# Patient Record
Sex: Male | Born: 1960 | Hispanic: No | Marital: Single | Smoking: Never smoker
Health system: Southern US, Community
[De-identification: ages and names within clinical notes are randomized; demographics above are authoritative.]

## PROBLEM LIST (undated history)

## (undated) DIAGNOSIS — R002 Palpitations: Secondary | ICD-10-CM

## (undated) DIAGNOSIS — F419 Anxiety disorder, unspecified: Secondary | ICD-10-CM

## (undated) DIAGNOSIS — E785 Hyperlipidemia, unspecified: Secondary | ICD-10-CM

## (undated) HISTORY — DX: Hyperlipidemia, unspecified: E78.5

## (undated) HISTORY — DX: Palpitations: R00.2

## (undated) HISTORY — DX: Anxiety disorder, unspecified: F41.9

---
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
## 2006-05-21 HISTORY — PX: US ECHOCARDIOGRAPHY: HXRAD669

## 2010-01-27 ENCOUNTER — Ambulatory Visit: Payer: Self-pay | Admitting: Cardiovascular Disease

## 2010-02-04 ENCOUNTER — Ambulatory Visit: Payer: Self-pay | Admitting: Cardiovascular Disease

## 2010-12-01 ENCOUNTER — Other Ambulatory Visit: Payer: Self-pay | Admitting: *Deleted

## 2010-12-01 MED ORDER — METOPROLOL SUCCINATE ER 50 MG PO TB24: 50.0000 mg | ORAL_TABLET | Freq: Every day | ORAL | Status: DC

## 2010-12-01 NOTE — Telephone Encounter (Signed)
PT CALLED WITH MESSAGE TO MAKE YEARLY OV, SCRIPT REFILLED

## 2010-12-27 ENCOUNTER — Encounter: Payer: Self-pay | Admitting: Cardiovascular Disease

## 2010-12-28 ENCOUNTER — Ambulatory Visit (INDEPENDENT_AMBULATORY_CARE_PROVIDER_SITE_OTHER): Payer: 59 | Admitting: Cardiovascular Disease

## 2010-12-28 ENCOUNTER — Encounter: Payer: Self-pay | Admitting: Cardiovascular Disease

## 2010-12-28 VITALS — BP 112/80 | HR 60 | Ht 70.0 in | Wt 212.0 lb

## 2010-12-28 DIAGNOSIS — E785 Hyperlipidemia, unspecified: Secondary | ICD-10-CM

## 2010-12-28 NOTE — Progress Notes (Signed)
Andrew Harrell Date of Birth  [birth year] [facility] [address]     [address] [place], [place]  [address] [phone]  Fax  [phone]  History of Present Illness:  Andrew Harrell is a relatively healthy 50 year old gentleman with a history of dyslipidemia. He has a strong family history of coronary disease. He's felt very well. He's not had any episodes of chest pain or shortness of breath.  He's exercising on a regular basis and is able to do all of his normal activities without any limitations.   Current Outpatient Prescriptions on File Prior to Visit  Medication Sig Dispense Refill  . aspirin 81 MG tablet Take 81 mg by mouth daily.        Marland Kitchen atorvastatin (LIPITOR) 40 MG tablet Take 40 mg by mouth daily.        Marland Kitchen FLUOXETINE HCL PO Take by mouth daily.        . metoprolol tartrate (LOPRESSOR) 25 MG tablet Take 25 mg by mouth daily.        Marland Kitchen NIACIN PO Take 1,500 mg by mouth.        . omega-3 acid ethyl esters (LOVAZA) 1 G capsule Take 2 g by mouth daily.        . Saw Palmetto, Serenoa repens, (SAW PALMETTO PO) Take by mouth.          Allergies  Allergen Reactions  . Prednisone     Causes a Rapid Heart Rate    Past Medical History  Diagnosis Date  . Dyslipidemia     with a low HDL  . Palpitations     Past Surgical History  Procedure Date  . US echocardiography 05-21-2006    Est EF 55-60%  . Hernia repair 1966    History  Smoking status  . Never Smoker   Smokeless tobacco  . Not on file    History  Alcohol Use No    Family History  Problem Relation Age of Onset  . Coronary artery disease Father   . Heart disease Father   . Heart failure Father   . Hyperlipidemia Father     Reviw of Systems:  Reviewed in the HPI.  All other systems are negative.  Physical Exam: BP 112/80  Pulse 60  Ht 5\' 10"  (1.778 m)  Wt 212 lb (96.163 kg)  BMI 30.42 kg/m2 The patient is alert and oriented x 3.  The mood and affect are normal.     Skin: warm and dry.  Color is normal.    HEENT:   the sclera are nonicteric.  The mucous membranes are moist.  The carotids are 2+ without bruits.  There is no thyromegaly.  There is no JVD.    Lungs: clear.  The chest wall is non tender.    Heart: regular rate with a normal S1 and S2.  There are no murmurs, gallops, or rubs. The PMI is not displaced.     Abdomen: good bowel sounds.  There is no guarding or rebound.  There is no hepatosplenomegaly or tenderness.  There are no masses.   Extremities:  no clubbing, cyanosis, or edema.  The legs are without rashes.  The distal pulses are intact.   Neuro:  Cranial nerves II - XII are intact.  Motor and sensory functions are intact.    The gait is normal.  ECG:  Assessment / Plan:

## 2010-12-28 NOTE — Assessment & Plan Note (Signed)
Plan is doing fairly well. He's on fairly high-dose therapy for his dyslipidemia. We are treating him aggressively because he has a very strong family history of coronary artery disease.  We'll continue with the same medications. I've encouraged him to exercise on a regular basis.

## 2010-12-29 ENCOUNTER — Encounter: Payer: Self-pay | Admitting: Cardiovascular Disease

## 2011-03-24 ENCOUNTER — Telehealth: Payer: Self-pay | Admitting: Cardiovascular Disease

## 2011-03-24 NOTE — Telephone Encounter (Signed)
Pt calling re getting his medical records straightened out, seems incorrect info in his chart

## 2011-03-27 ENCOUNTER — Telehealth: Payer: Self-pay | Admitting: Cardiovascular Disease

## 2011-03-27 NOTE — Telephone Encounter (Signed)
Pt called, msg left at home number, cell number out of service, asked him to update cell number and to call back to discuss MR.

## 2011-03-27 NOTE — Telephone Encounter (Signed)
Pt wanted to know if he had on his last ov a dx of CAD, answered no, only has hyperlipidemia.

## 2011-03-27 NOTE — Telephone Encounter (Addendum)
Pt CAlled asking for Copies Of Medical Records, They are copied he will pick up Wednesday 03/29/11/km    03/27/11/km  Pt Picked up Records 03/28/11/km

## 2011-05-09 HISTORY — PX: COLONOSCOPY: SHX174

## 2011-06-05 ENCOUNTER — Other Ambulatory Visit: Payer: Self-pay | Admitting: *Deleted

## 2011-06-06 ENCOUNTER — Other Ambulatory Visit: Payer: Self-pay | Admitting: *Deleted

## 2011-06-06 MED ORDER — METOPROLOL SUCCINATE ER 50 MG PO TB24: 50.0000 mg | ORAL_TABLET | Freq: Every day | ORAL | Status: DC

## 2011-06-08 ENCOUNTER — Other Ambulatory Visit: Payer: Self-pay | Admitting: *Deleted

## 2011-07-28 ENCOUNTER — Encounter: Payer: Self-pay | Admitting: Gastroenterology

## 2011-09-04 ENCOUNTER — Ambulatory Visit (AMBULATORY_SURGERY_CENTER): Payer: 59 | Admitting: *Deleted

## 2011-09-04 ENCOUNTER — Encounter: Payer: Self-pay | Admitting: Gastroenterology

## 2011-09-04 VITALS — Ht 70.0 in | Wt 215.9 lb

## 2011-09-04 DIAGNOSIS — Z1211 Encounter for screening for malignant neoplasm of colon: Secondary | ICD-10-CM

## 2011-09-04 MED ORDER — PEG-KCL-NACL-NASULF-NA ASC-C 100 G PO SOLR: ORAL | Status: DC

## 2011-09-18 ENCOUNTER — Ambulatory Visit (AMBULATORY_SURGERY_CENTER): Payer: 59 | Admitting: Gastroenterology

## 2011-09-18 ENCOUNTER — Encounter: Payer: Self-pay | Admitting: Gastroenterology

## 2011-09-18 VITALS — BP 114/64 | HR 66 | Temp 97.6°F | Resp 15 | Ht 70.0 in | Wt 215.0 lb

## 2011-09-18 DIAGNOSIS — K5730 Diverticulosis of large intestine without perforation or abscess without bleeding: Secondary | ICD-10-CM

## 2011-09-18 DIAGNOSIS — Z1211 Encounter for screening for malignant neoplasm of colon: Secondary | ICD-10-CM

## 2011-09-18 MED ORDER — SODIUM CHLORIDE 0.9 % IV SOLN: 500.0000 mL | INTRAVENOUS | Status: DC

## 2011-09-18 NOTE — Op Note (Signed)
[facility] [address] [place], [place]  [address]  COLONOSCOPY PROCEDURE REPORT  PATIENT:  Andrew Harrell, Andrew Harrell  MR#:  [id number] BIRTHDATE:  08/08/1960, 50 yrs. old  GENDER:  male ENDOSCOPIST:  Vania Rea. Jarold Motto, MD, [facility] REF. BY:  Jarome Matin, M.D. PROCEDURE DATE:  09/18/2011 PROCEDURE:  Average-risk screening colonoscopy G0121 ASA CLASS:  Class II INDICATIONS:  Routine Risk Screening MEDICATIONS:   propofol (Diprivan) 150 mg IV  DESCRIPTION OF PROCEDURE:   After the risks and benefits and of the procedure were explained, informed consent was obtained. Digital rectal exam was performed and revealed no abnormalities. The LB [id number] endoscope was introduced through the anus and advanced to the cecum, which was identified by both the appendix and ileocecal valve.  The quality of the prep was excellent, using MoviPrep.  The instrument was then slowly withdrawn as the colon was fully examined. <<PROCEDUREIMAGES>>  FINDINGS:  There were mild diverticular changes in left colon. diverticulosis was found.  No polyps or cancers were seen.  This was otherwise a normal examination of the colon.   Retroflexed views in the rectum revealed no abnormalities.    The scope was then withdrawn from the patient and the procedure completed.  COMPLICATIONS:  None ENDOSCOPIC IMPRESSION: 1) Diverticulosis,mild,left sided diverticulosis 2) No polyps or cancers 3) Otherwise normal examination RECOMMENDATIONS: 1) High fiber diet. 2) Continue current colorectal screening recommendations for "routine risk" patients with a repeat colonoscopy in 10 years.  REPEAT EXAM:  No  ______________________________ Vania Rea. Jarold Motto, MD, [facility]  CC:  n. eSIGNED:   Vania Rea. Elka Satterfield at 09/18/2011 08:50 AM  [name], [phone]

## 2011-09-18 NOTE — Progress Notes (Signed)
Patient did not experience any of the following events: a burn prior to discharge; a fall within the facility; wrong site/side/patient/procedure/implant event; or a hospital transfer or hospital admission upon discharge from the facility. (G8907) Patient did not have preoperative order for IV antibiotic SSI prophylaxis. (G8918)  

## 2011-09-18 NOTE — Patient Instructions (Signed)

## 2011-09-19 ENCOUNTER — Telehealth: Payer: Self-pay | Admitting: *Deleted

## 2011-09-19 NOTE — Telephone Encounter (Signed)
  Follow up Call-  Call back number 09/18/2011  Post procedure Call Back phone  # [phone]  Permission to leave phone message Yes     Patient questions:  Do you have a fever, pain , or abdominal swelling? no Pain Score  0 *  Have you tolerated food without any problems? yes  Have you been able to return to your normal activities? yes  Do you have any questions about your discharge instructions: Diet   no Medications  no Follow up visit  no  Do you have questions or concerns about your Care? no  Actions: * If pain score is 4 or above: No action needed, pain <4.

## 2012-08-02 ENCOUNTER — Other Ambulatory Visit: Payer: Self-pay | Admitting: *Deleted

## 2012-08-02 MED ORDER — METOPROLOL SUCCINATE ER 50 MG PO TB24: 50.0000 mg | ORAL_TABLET | Freq: Every day | ORAL | Status: DC

## 2012-08-02 NOTE — Telephone Encounter (Signed)
Fax Received. Refill Completed. Andrew Harrell (R.M.A)  NO REFILL UNTIL APPOINTMENT

## 2012-10-28 ENCOUNTER — Ambulatory Visit (INDEPENDENT_AMBULATORY_CARE_PROVIDER_SITE_OTHER): Payer: Self-pay | Admitting: Cardiovascular Disease

## 2012-10-28 ENCOUNTER — Encounter: Payer: Self-pay | Admitting: Cardiovascular Disease

## 2012-10-28 VITALS — BP 110/80 | HR 62 | Ht 70.0 in | Wt 214.0 lb

## 2012-10-28 DIAGNOSIS — E785 Hyperlipidemia, unspecified: Secondary | ICD-10-CM | POA: Insufficient documentation

## 2012-10-28 LAB — LIPID PANEL
Cholesterol: 124 mg/dL (ref 0–200)
LDL Cholesterol: 70 mg/dL (ref 0–99)
Total CHOL/HDL Ratio: 3

## 2012-10-28 LAB — BASIC METABOLIC PANEL
BUN: 11 mg/dL (ref 6–23)
CO2: 30 mEq/L (ref 19–32)
Chloride: 104 mEq/L (ref 96–112)
Creatinine, Ser: 1 mg/dL (ref 0.4–1.5)

## 2012-10-28 LAB — HEPATIC FUNCTION PANEL
Alkaline Phosphatase: 36 U/L — ABNORMAL LOW (ref 39–117)
Bilirubin, Direct: 0.1 mg/dL (ref 0.0–0.3)

## 2012-10-28 MED ORDER — METOPROLOL SUCCINATE ER 50 MG PO TB24: 50.0000 mg | ORAL_TABLET | Freq: Every day | ORAL | Status: DC

## 2012-10-28 NOTE — Assessment & Plan Note (Signed)
Andrew Harrell is doing well.  No complaints.  Will check labs today.  Continue current meds.  I will see him in 1 year for OV, labs, and ECG.

## 2012-10-28 NOTE — Progress Notes (Signed)
Andrew Harrell Date of Birth  [birth year] Andrew Harrell [facility] [address]     [address] [place], [place]  [address] [phone]  Fax  [phone]  History of Present Illness:  Andrew Harrell is a relatively healthy 52 year old gentleman with a history of dyslipidemia. He has a strong family history of coronary disease. He's felt very well. He's not had any episodes of chest pain or shortness of breath.  He's exercising on a regular basis and is able to do all of his normal activities without any limitations.  October 28, 2012:  Andrew Harrell has done well.  No CP or dyspnea.  He   Current Outpatient Prescriptions on File Prior to Visit  Medication Sig Dispense Refill  . aspirin 81 MG tablet Take 81 mg by mouth daily.        Marland Kitchen atorvastatin (LIPITOR) 40 MG tablet Take 40 mg by mouth daily.        Marland Kitchen FLUOXETINE HCL PO Take by mouth daily.        . metoprolol succinate (TOPROL-XL) 50 MG 24 hr tablet Take 1 tablet (50 mg total) by mouth daily. Take with or immediately following a meal.  30 tablet  3  . NIACIN PO Take 1,500 mg by mouth.        . omega-3 acid ethyl esters (LOVAZA) 1 G capsule Take 2 g by mouth daily.        . Saw Palmetto, Serenoa repens, (SAW PALMETTO PO) Take by mouth.         No current facility-administered medications on file prior to visit.    Allergies  Allergen Reactions  . Prednisone     Causes a Rapid Heart Rate    Past Medical History  Diagnosis Date  . Dyslipidemia     with a low HDL  . Palpitations   . Anxiety     Past Surgical History  Procedure Laterality Date  . US echocardiography  05-21-2006    Est EF 55-60%  . Hernia repair  1966    History  Smoking status  . Never Smoker   Smokeless tobacco  . Never Used    History  Alcohol Use No    Family History  Problem Relation Age of Onset  . Coronary artery disease Father   . Heart disease Father   . Heart failure Father   . Hyperlipidemia Father   . Colon cancer Neg  Hx   . Stomach cancer Neg Hx     Reviw of Systems:  Reviewed in the HPI.  All other systems are negative.  Physical Exam: BP 110/80  Pulse 62  Ht 5\' 10"  (1.778 m)  Wt 214 lb (97.07 kg)  BMI 30.71 kg/m2 The patient is alert and oriented x 3.  The mood and affect are normal.   Skin: warm and dry.  Color is normal.    HEENT:   the sclera are nonicteric.  The mucous membranes are moist.  The carotids are 2+ without bruits.  There is no thyromegaly.  There is no JVD.    Lungs: clear.  The chest wall is non tender.    Heart: regular rate with a normal S1 and S2.  There are no murmurs, gallops, or rubs. The PMI is not displaced.     Abdomen: good bowel sounds.  There is no guarding or rebound.  There is no hepatosplenomegaly or tenderness.  There are no masses.   Extremities:  no clubbing, cyanosis, or edema.  The legs are without rashes.  The distal pulses are intact.   Neuro:  Cranial nerves II - XII are intact.  Motor and sensory functions are intact.    The gait is normal.  ECG: October 28, 2012:  NSR at 62, no ST or T wave changes   Assessment / Plan:

## 2012-10-28 NOTE — Patient Instructions (Signed)
Your physician recommends that you return for lab work in: today lipid hepatic bmet  Your physician wants you to follow-up in: 1 year  You will receive a reminder letter in the mail two months in advance. If you don't receive a letter, please call our office to schedule the follow-up appointment.  Your physician recommends that you return for a FASTING lipid profile: 1 year

## 2013-03-13 ENCOUNTER — Other Ambulatory Visit: Payer: Self-pay

## 2013-11-14 ENCOUNTER — Ambulatory Visit (INDEPENDENT_AMBULATORY_CARE_PROVIDER_SITE_OTHER): Payer: BC Managed Care – PPO | Admitting: *Deleted

## 2013-11-14 DIAGNOSIS — E785 Hyperlipidemia, unspecified: Secondary | ICD-10-CM

## 2013-11-14 LAB — CBC WITH DIFFERENTIAL/PLATELET
BASOS PCT: 0.3 % (ref 0.0–3.0)
Basophils Absolute: 0 10*3/uL (ref 0.0–0.1)
Eosinophils Absolute: 0.1 10*3/uL (ref 0.0–0.7)
Eosinophils Relative: 1.7 % (ref 0.0–5.0)
HEMATOCRIT: 44.4 % (ref 39.0–52.0)
HEMOGLOBIN: 15 g/dL (ref 13.0–17.0)
LYMPHS ABS: 2.1 10*3/uL (ref 0.7–4.0)
Lymphocytes Relative: 38 % (ref 12.0–46.0)
MCHC: 33.8 g/dL (ref 30.0–36.0)
MCV: 99.6 fl (ref 78.0–100.0)
MONOS PCT: 9.9 % (ref 3.0–12.0)
Monocytes Absolute: 0.6 10*3/uL (ref 0.1–1.0)
NEUTROS ABS: 2.8 10*3/uL (ref 1.4–7.7)
Neutrophils Relative %: 50.1 % (ref 43.0–77.0)
PLATELETS: 194 10*3/uL (ref 150.0–400.0)
RBC: 4.45 Mil/uL (ref 4.22–5.81)
RDW: 13.2 % (ref 11.5–15.5)
WBC: 5.6 10*3/uL (ref 4.0–10.5)

## 2013-11-14 LAB — BASIC METABOLIC PANEL
BUN: 16 mg/dL (ref 6–23)
CALCIUM: 8.8 mg/dL (ref 8.4–10.5)
CO2: 31 mEq/L (ref 19–32)
Chloride: 103 mEq/L (ref 96–112)
Creatinine, Ser: 1 mg/dL (ref 0.4–1.5)
GFR: 81.23 mL/min (ref >60.00)
GLUCOSE: 97 mg/dL (ref 70–99)
POTASSIUM: 3.7 meq/L (ref 3.5–5.1)
SODIUM: 138 meq/L (ref 135–145)

## 2013-11-14 LAB — LIPID PANEL
Cholesterol: 140 mg/dL (ref 0–200)
HDL: 39.6 mg/dL (ref >39.00)
LDL CALC: 86 mg/dL (ref 0–99)
NonHDL: 100.4
Total CHOL/HDL Ratio: 4
Triglycerides: 72 mg/dL (ref 0.0–149.0)
VLDL: 14.4 mg/dL (ref 0.0–40.0)

## 2013-11-14 LAB — HEPATIC FUNCTION PANEL
ALT: 35 U/L (ref 0–53)
AST: 30 U/L (ref 0–37)
Albumin: 3.7 g/dL (ref 3.5–5.2)
Alkaline Phosphatase: 30 U/L — ABNORMAL LOW (ref 39–117)
Bilirubin, Direct: 0.2 mg/dL (ref 0.0–0.3)
Total Bilirubin: 1.2 mg/dL (ref 0.2–1.2)
Total Protein: 6.5 g/dL (ref 6.0–8.3)

## 2013-11-17 ENCOUNTER — Ambulatory Visit (INDEPENDENT_AMBULATORY_CARE_PROVIDER_SITE_OTHER): Payer: BC Managed Care – PPO | Admitting: Cardiovascular Disease

## 2013-11-17 ENCOUNTER — Encounter: Payer: Self-pay | Admitting: Cardiovascular Disease

## 2013-11-17 VITALS — BP 108/64 | HR 68 | Ht 70.0 in | Wt 211.8 lb

## 2013-11-17 DIAGNOSIS — E785 Hyperlipidemia, unspecified: Secondary | ICD-10-CM

## 2013-11-17 NOTE — Assessment & Plan Note (Signed)
[name] seems to be doing well. His lipids are well controlled. He needs to get out and walk a little bit more than he has been. He's not having any episodes of chest pain or shortness breath. We'll continue with his same medications. [place] again in one year.

## 2013-11-17 NOTE — Patient Instructions (Signed)
Your physician recommends that you continue on your current medications as directed. Please refer to the Current Medication list given to you today.  Your physician wants you to follow-up in: 1 year with Dr. Nahser.  You will receive a reminder letter in the mail two months in advance. If you don't receive a letter, please call our office to schedule the follow-up appointment.  

## 2013-11-17 NOTE — Progress Notes (Signed)
Andrew CoganBenjamin Harrell Date of Birth  12-31-1960 [facility] [address]     [address] [place], [place]  [address] [phone]  Fax  [phone]  History of Present Illness:  Andrew Harrell is a relatively healthy 53 year old gentleman with a history of dyslipidemia. He has a strong family history of coronary disease. He's felt very well. He's not had any episodes of chest pain or shortness of breath.  He's exercising on a regular basis and is able to do all of his normal activities without any limitations.  October 28, 2012:  Andrew Harrell has done well.  No CP or dyspnea.     November 17, 2013: Andrew Harrell is doing well.   Not exercising as much - still walks twice a week.      Current Outpatient Prescriptions on File Prior to Visit  Medication Sig Dispense Refill  . aspirin 81 MG tablet Take 81 mg by mouth daily.        Marland Kitchen. atorvastatin (LIPITOR) 40 MG tablet Take 40 mg by mouth daily.        Marland Kitchen. FLUOXETINE HCL PO Take by mouth daily.        . metoprolol succinate (TOPROL-XL) 50 MG 24 hr tablet Take 1 tablet (50 mg total) by mouth daily. Take with or immediately following a meal.  30 tablet  3  . NIACIN PO Take 1,500 mg by mouth.        . omega-3 acid ethyl esters (LOVAZA) 1 G capsule Take 2 g by mouth daily.        . Saw Palmetto, Serenoa repens, (SAW PALMETTO PO) Take by mouth.         No current facility-administered medications on file prior to visit.    Allergies  Allergen Reactions  . Prednisone     Causes a Rapid Heart Rate    Past Medical History  Diagnosis Date  . Dyslipidemia     with a low HDL  . Palpitations   . Anxiety     Past Surgical History  Procedure Laterality Date  . [place] echocardiography  05-21-2006    Est EF 55-60%  . Hernia repair  1966    History  Smoking status  . Never Smoker   Smokeless tobacco  . Never Used    History  Alcohol Use No    Family History  Problem Relation Age of Onset  . Coronary artery disease Father    . Heart disease Father   . Heart failure Father   . Hyperlipidemia Father   . Colon cancer Neg Hx   . Stomach cancer Neg Hx     Reviw of Systems:  Reviewed in the HPI.  All other systems are negative.  Physical Exam: BP 108/64  Pulse 68  Ht 5\' 10"  (1.778 m)  Wt 211 lb 12.8 oz (96.072 kg)  BMI 30.39 kg/m2 The patient is alert and oriented x 3.  The mood and affect are normal.   Skin: warm and dry.  Color is normal.    HEENT:   the sclera are nonicteric.  The mucous membranes are moist.  The carotids are 2+ without bruits.  There is no thyromegaly.  There is no JVD.    Lungs: clear.  The chest wall is non tender.    Heart: regular rate with a normal S1 and S2.  There are no murmurs, gallops, or rubs. The PMI is not displaced.     Abdomen: good bowel sounds.  There is no guarding or rebound.  There is no hepatosplenomegaly or tenderness.  There are no masses.   Extremities:  no clubbing, cyanosis, or edema.  The legs are without rashes.  The distal pulses are intact.   Neuro:  Cranial nerves II - XII are intact.  Motor and sensory functions are intact.    The gait is normal.  ECG: November 17, 2013:  NSR  At 71.    Assessment / Plan:

## 2013-11-27 ENCOUNTER — Other Ambulatory Visit: Payer: Self-pay | Admitting: Cardiovascular Disease

## 2014-11-19 ENCOUNTER — Other Ambulatory Visit: Payer: Self-pay

## 2014-11-19 MED ORDER — METOPROLOL SUCCINATE ER 50 MG PO TB24: ORAL_TABLET | ORAL | Status: DC

## 2015-01-12 ENCOUNTER — Encounter: Payer: Self-pay | Admitting: Cardiovascular Disease

## 2015-01-12 ENCOUNTER — Ambulatory Visit (INDEPENDENT_AMBULATORY_CARE_PROVIDER_SITE_OTHER): Payer: No Typology Code available for payment source | Admitting: Cardiovascular Disease

## 2015-01-12 VITALS — BP 98/70 | HR 60 | Ht 70.0 in | Wt 215.2 lb

## 2015-01-12 DIAGNOSIS — E785 Hyperlipidemia, unspecified: Secondary | ICD-10-CM

## 2015-01-12 LAB — LIPID PANEL
CHOL/HDL RATIO: 3
Cholesterol: 118 mg/dL (ref 0–200)
HDL: 35.9 mg/dL — ABNORMAL LOW (ref >39.00)
LDL Cholesterol: 66 mg/dL (ref 0–99)
NONHDL: 82.32
Triglycerides: 84 mg/dL (ref 0.0–149.0)
VLDL: 16.8 mg/dL (ref 0.0–40.0)

## 2015-01-12 LAB — HEPATIC FUNCTION PANEL
ALK PHOS: 27 U/L — AB (ref 39–117)
ALT: 37 U/L (ref 0–53)
AST: 28 U/L (ref 0–37)
Albumin: 3.9 g/dL (ref 3.5–5.2)
BILIRUBIN DIRECT: 0.2 mg/dL (ref 0.0–0.3)
BILIRUBIN TOTAL: 0.8 mg/dL (ref 0.2–1.2)
Total Protein: 6.4 g/dL (ref 6.0–8.3)

## 2015-01-12 LAB — BASIC METABOLIC PANEL
BUN: 14 mg/dL (ref 6–23)
CALCIUM: 8.8 mg/dL (ref 8.4–10.5)
CO2: 32 mEq/L (ref 19–32)
Chloride: 103 mEq/L (ref 96–112)
Creatinine, Ser: 1.02 mg/dL (ref 0.40–1.50)
GFR: 80.87 mL/min (ref >60.00)
GLUCOSE: 100 mg/dL — AB (ref 70–99)
POTASSIUM: 4.1 meq/L (ref 3.5–5.1)
Sodium: 140 mEq/L (ref 135–145)

## 2015-01-12 NOTE — Patient Instructions (Signed)
Medication Instructions:  Your physician recommends that you continue on your current medications as directed. Please refer to the Current Medication list given to you today.   Labwork: TODAY - cholesterol, liver, basic metabolic panel  Your physician recommends that you return for lab work in: 1 year on the day of or a few days before your office visit with Dr. Nahser.  You will need to FAST for this appointment - nothing to eat or drink after midnight the night before except water.   Testing/Procedures: None Ordered   Follow-Up: Your physician wants you to follow-up in: 1 year with Dr. Nahser.  You will receive a reminder letter in the mail two months in advance. If you don't receive a letter, please call our office to schedule the follow-up appointment.     

## 2015-01-12 NOTE — Progress Notes (Signed)
Andrew Harrell Date of Birth  09-28-1960 [facility] [address]     [address] [place], [place]  [address] [phone]  Fax  [phone]  Problem List 1. Dyslipidemia.   History of Present Illness:  Andrew Harrell is a relatively healthy 54 year old gentleman with a history of dyslipidemia. He has a strong family history of coronary disease. He's felt very well. He's not had any episodes of chest pain or shortness of breath.  He's exercising on a regular basis and is able to do all of his normal activities without any limitations.  October 28, 2012:  Andrew Harrell has done well.  No CP or dyspnea.     November 17, 2013: Andrew Harrell is doing well.   Not exercising as much - still walks twice a week.    Sept. 6, 2016:  Andrew Harrell is doing well. No CP , Spent the weekend at the beach .  BP is normal     Current Outpatient Prescriptions on File Prior to Visit  Medication Sig Dispense Refill  . aspirin 81 MG tablet Take 81 mg by mouth daily.      Marland Kitchen atorvastatin (LIPITOR) 40 MG tablet Take 40 mg by mouth daily.      Marland Kitchen FLUOXETINE HCL PO Take by mouth daily. PATIENT IS UNSURE OF MILLIGRAM..    . metoprolol succinate (TOPROL-XL) 50 MG 24 hr tablet TAKE 1 TABLET BY MOUTH EVERY DAY WITH OR IMMEDIATELY FOLLOWING A MEAL 30 tablet 6  . NIACIN PO Take 1,500 mg by mouth daily.     Marland Kitchen omega-3 acid ethyl esters (LOVAZA) 1 G capsule Take 2 g by mouth daily. TAKE 2 TABLETS BY MOUTH DAILY    . Saw Palmetto, Serenoa repens, (SAW PALMETTO PO) Take by mouth daily. PATIENT IS UNSURE OF MILLIGRAMS..     No current facility-administered medications on file prior to visit.    Allergies  Allergen Reactions  . Prednisone     Causes a Rapid Heart Rate    Past Medical History  Diagnosis Date  . Dyslipidemia     with a low HDL  . Palpitations   . Anxiety     Past Surgical History  Procedure Laterality Date  . US echocardiography  05-21-2006    Est EF 55-60%  . Hernia repair   1966    History  Smoking status  . Never Smoker   Smokeless tobacco  . Never Used    History  Alcohol Use No    Family History  Problem Relation Age of Onset  . Coronary artery disease Father   . Heart disease Father   . Heart failure Father   . Hyperlipidemia Father   . Colon cancer Neg Hx   . Stomach cancer Neg Hx     Reviw of Systems:  Reviewed in the HPI.  All other systems are negative.  Physical Exam: BP 98/70 mmHg  Pulse 60  Ht 5\' 10"  (1.778 m)  Wt 97.614 kg (215 lb 3.2 oz)  BMI 30.88 kg/m2 The patient is alert and oriented x 3.  The mood and affect are normal.   Skin: warm and dry.  Color is normal.    HEENT:   the sclera are nonicteric.  The mucous membranes are moist.  The carotids are 2+ without bruits.  There is no thyromegaly.  There is no JVD.    Lungs: clear.  The chest wall is non tender.    Heart: regular rate with a normal  S1 and S2.  There are no murmurs, gallops, or rubs. The PMI is not displaced.     Abdomen: good bowel sounds.  There is no guarding or rebound.  There is no hepatosplenomegaly or tenderness.  There are no masses.   Extremities:  no clubbing, cyanosis, or edema.  The legs are without rashes.  The distal pulses are intact.   Neuro:  Cranial nerves II - XII are intact.  Motor and sensory functions are intact.    The gait is normal.  ECG: 01/12/2015: Normal sinus rhythm at 60. EKG is normal.  Assessment / Plan:   1. Dyslipidemia: Continue with current medications. We'll check fasting labs today. He exercises on a regular basis.  We'll see him again in one year. He eats a very low fat and low cholesterol diet. He exercises fairly regular.   Nahser, Deloris Ping, MD  01/12/2015 9:30 AM    [facility] [address] [place],  [address] [place], [place]  [address] Pager [phone] Phone: [phone]; Fax: [phone]   [facility]  [address] [place], [place]  [address] [phone]   Fax [phone]

## 2015-10-29 ENCOUNTER — Other Ambulatory Visit: Payer: Self-pay | Admitting: Cardiovascular Disease

## 2016-01-21 ENCOUNTER — Ambulatory Visit: Payer: No Typology Code available for payment source | Admitting: Cardiovascular Disease

## 2016-02-14 ENCOUNTER — Encounter: Payer: Self-pay | Admitting: Cardiovascular Disease

## 2016-02-25 ENCOUNTER — Encounter: Payer: Self-pay | Admitting: Cardiovascular Disease

## 2016-02-25 ENCOUNTER — Ambulatory Visit (INDEPENDENT_AMBULATORY_CARE_PROVIDER_SITE_OTHER): Payer: PRIVATE HEALTH INSURANCE | Admitting: Cardiovascular Disease

## 2016-02-25 VITALS — BP 110/74 | HR 57 | Ht 70.0 in | Wt 211.0 lb

## 2016-02-25 DIAGNOSIS — E782 Mixed hyperlipidemia: Secondary | ICD-10-CM | POA: Diagnosis not present

## 2016-02-25 LAB — COMPREHENSIVE METABOLIC PANEL
ALK PHOS: 38 U/L — AB (ref 40–115)
ALT: 22 U/L (ref 9–46)
AST: 23 U/L (ref 10–35)
Albumin: 3.9 g/dL (ref 3.6–5.1)
BUN: 14 mg/dL (ref 7–25)
CHLORIDE: 103 mmol/L (ref 98–110)
CO2: 27 mmol/L (ref 20–31)
CREATININE: 1.09 mg/dL (ref 0.70–1.33)
Calcium: 8.9 mg/dL (ref 8.6–10.3)
GLUCOSE: 97 mg/dL (ref 65–99)
POTASSIUM: 4.4 mmol/L (ref 3.5–5.3)
SODIUM: 140 mmol/L (ref 135–146)
TOTAL PROTEIN: 6.7 g/dL (ref 6.1–8.1)
Total Bilirubin: 0.6 mg/dL (ref 0.2–1.2)

## 2016-02-25 LAB — LIPID PANEL
Cholesterol: 134 mg/dL (ref 125–200)
HDL: 57 mg/dL (ref >=40)
LDL CALC: 66 mg/dL (ref <130)
Total CHOL/HDL Ratio: 2.4 Ratio (ref <=5.0)
Triglycerides: 57 mg/dL (ref <150)
VLDL: 11 mg/dL (ref <30)

## 2016-02-25 NOTE — Patient Instructions (Signed)
Medication Instructions:  Your physician recommends that you continue on your current medications as directed. Please refer to the Current Medication list given to you today.   Labwork: TODAY - cholesterol, complete metabolic panel   Testing/Procedures: None Ordered   Follow-Up: Your physician wants you to follow-up in: 1 year with Dr. Nahser.  You will receive a reminder letter in the mail two months in advance. If you don't receive a letter, please call our office to schedule the follow-up appointment.   If you need a refill on your cardiac medications before your next appointment, please call your pharmacy.   Thank you for choosing [facility]! Jakeia Carreras, RN [phone]    

## 2016-02-25 NOTE — Progress Notes (Signed)
Andrew Harrell Date of Birth  [birth year] [facility] [address]     [address] [place], [place]  [address] [phone]  Fax  [phone]  Problem List 1. Dyslipidemia.    Andrew Harrell is a relatively healthy 55 year old gentleman with a history of dyslipidemia. He has a strong family history of coronary disease. He's felt very well. He's not had any episodes of chest pain or shortness of breath.  He's exercising on a regular basis and is able to do all of his normal activities without any limitations.  October 28, 2012:  Andrew Harrell has done well.  No CP or dyspnea.     November 17, 2013: Andrew Harrell is doing well.   Not exercising as much - still walks twice a week.    Sept. 6, 2016:  Andrew Harrell is doing well. No CP , Spent the weekend at the beach .  BP is normal   Oct. 20, 2017:  Andrew Harrell is doing very well. He denies any chest pain or shortness of breath.  He's been taking one half of his Toprol-XL dose and feels very well.   Current Outpatient Prescriptions on File Prior to Visit  Medication Sig Dispense Refill  . aspirin 81 MG tablet Take 81 mg by mouth daily.      Marland Kitchen atorvastatin (LIPITOR) 40 MG tablet Take 40 mg by mouth daily.      Marland Kitchen FLUOXETINE HCL PO Take by mouth daily. PATIENT IS UNSURE OF MILLIGRAM..    . metoprolol succinate (TOPROL-XL) 50 MG 24 hr tablet TAKE 1 TABLET BY MOUTH EVERY DAY WITH OR IMMEDIATELY FOLLOWING A MEAL 30 tablet 2  . NIACIN PO Take 1,500 mg by mouth daily.     Marland Kitchen omega-3 acid ethyl esters (LOVAZA) 1 G capsule Take 2 g by mouth daily. TAKE 2 TABLETS BY MOUTH DAILY    . Saw Palmetto, Serenoa repens, (SAW PALMETTO PO) Take by mouth daily. PATIENT IS UNSURE OF MILLIGRAMS..     No current facility-administered medications on file prior to visit.     Allergies  Allergen Reactions  . Prednisone     Causes a Rapid Heart Rate    Past Medical History:  Diagnosis Date  . Anxiety   . Dyslipidemia    with a low HDL  .  Palpitations     Past Surgical History:  Procedure Laterality Date  . HERNIA REPAIR  1966  . US ECHOCARDIOGRAPHY  05-21-2006   Est EF 55-60%    History  Smoking Status  . Never Smoker  Smokeless Tobacco  . Never Used    History  Alcohol Use No    Family History  Problem Relation Age of Onset  . Coronary artery disease Father   . Heart disease Father   . Heart failure Father   . Hyperlipidemia Father   . Colon cancer Neg Hx   . Stomach cancer Neg Hx     Reviw of Systems:  Reviewed in the HPI.  All other systems are negative.  Physical Exam: BP 110/74   Pulse (!) 57   Ht 5\' 10"  (1.778 m)   Wt 211 lb (95.7 kg)   BMI 30.28 kg/m  The patient is alert and oriented x 3.  The mood and affect are normal.   Skin: warm and dry.  Color is normal.   HEENT:   the sclera are nonicteric.  The mucous membranes are moist.  The carotids are 2+ without bruits.  There is  no thyromegaly.  There is no JVD.   Lungs: clear.  The chest wall is non tender.   Heart: regular rate with a normal S1 and S2.  There are no murmurs, gallops, or rubs. The PMI is not displaced.    Abdomen: good bowel sounds.  There is no guarding or rebound.  There is no hepatosplenomegaly or tenderness.  There are no masses.  Extremities:  no clubbing, cyanosis, or edema.  The legs are without rashes.  The distal pulses are intact.  Neuro:  Cranial nerves II - XII are intact.  Motor and sensory functions are intact.    The gait is normal.  ECG: 02/25/2016: Sinus bradycardia at 57. EKG is otherwise normal. Assessment / Plan:   1. Dyslipidemia: Continue with current medications. We'll check fasting labs today. He exercises on a regular basis.  We'll see him again in one year. He eats a very low fat and low cholesterol diet. He exercises fairly regular.   Kristeen MissPhilip Solomiya Pascale, MD  02/25/2016 10:12 AM    [facility] [address]  [address] [place], [place]  [address] Pager [phone]  [phone] Phone: [phone]; Fax: [phone]

## 2016-03-03 ENCOUNTER — Encounter: Payer: Self-pay | Admitting: Cardiovascular Disease

## 2016-07-15 ENCOUNTER — Other Ambulatory Visit: Payer: Self-pay | Admitting: Cardiovascular Disease

## 2016-07-25 ENCOUNTER — Other Ambulatory Visit: Payer: Self-pay | Admitting: Cardiovascular Disease

## 2017-05-18 ENCOUNTER — Encounter: Payer: Self-pay | Admitting: Cardiovascular Disease

## 2017-05-18 ENCOUNTER — Ambulatory Visit: Payer: PRIVATE HEALTH INSURANCE | Admitting: Cardiovascular Disease

## 2017-05-18 VITALS — BP 108/74 | HR 59 | Ht 70.0 in | Wt 205.8 lb

## 2017-05-18 DIAGNOSIS — E782 Mixed hyperlipidemia: Secondary | ICD-10-CM | POA: Diagnosis not present

## 2017-05-18 DIAGNOSIS — I10 Essential (primary) hypertension: Secondary | ICD-10-CM

## 2017-05-18 LAB — HEPATIC FUNCTION PANEL
ALT: 26 IU/L (ref 0–44)
AST: 20 IU/L (ref 0–40)
Albumin: 4 g/dL (ref 3.5–5.5)
Alkaline Phosphatase: 34 IU/L — ABNORMAL LOW (ref 39–117)
Bilirubin Total: 0.5 mg/dL (ref 0.0–1.2)
Bilirubin, Direct: 0.17 mg/dL (ref 0.00–0.40)
Total Protein: 6.1 g/dL (ref 6.0–8.5)

## 2017-05-18 LAB — BASIC METABOLIC PANEL
BUN / CREAT RATIO: 14 (ref 9–20)
BUN: 12 mg/dL (ref 6–24)
CO2: 25 mmol/L (ref 20–29)
Calcium: 8.8 mg/dL (ref 8.7–10.2)
Chloride: 102 mmol/L (ref 96–106)
Creatinine, Ser: 0.84 mg/dL (ref 0.76–1.27)
GFR, EST AFRICAN AMERICAN: 113 mL/min/{1.73_m2} (ref >59)
GFR, EST NON AFRICAN AMERICAN: 98 mL/min/{1.73_m2} (ref >59)
Glucose: 102 mg/dL — ABNORMAL HIGH (ref 65–99)
POTASSIUM: 4.1 mmol/L (ref 3.5–5.2)
SODIUM: 140 mmol/L (ref 134–144)

## 2017-05-18 LAB — LIPID PANEL
CHOLESTEROL TOTAL: 116 mg/dL (ref 100–199)
Chol/HDL Ratio: 2.8 ratio (ref 0.0–5.0)
HDL: 41 mg/dL (ref >39)
LDL CALC: 64 mg/dL (ref 0–99)
TRIGLYCERIDES: 54 mg/dL (ref 0–149)
VLDL CHOLESTEROL CAL: 11 mg/dL (ref 5–40)

## 2017-05-18 NOTE — Progress Notes (Signed)
Andrew CoganBenjamin Harrell Date of Birth  [birth year] [facility] [address]     [address] [place], [place]  [address] [phone]  Fax  [phone]  Problem List 1. Dyslipidemia.    Andrew RaringDuane is a relatively healthy 57 year old gentleman with a history of dyslipidemia. He has a strong family history of coronary disease. He's felt very well. He's not had any episodes of chest pain or shortness of breath.  He's exercising on a regular basis and is able to do all of his normal activities without any limitations.  October 28, 2012:  Andrew Harrell has done well.  No CP or dyspnea.     November 17, 2013: Andrew RaringDuane is doing well.   Not exercising as much - still walks twice a week.    Sept. 6, 2016:  Andrew RaringDuane is doing well. No CP , Spent the weekend at the beach .  BP is normal   Oct. 20, 2017:  Andrew RaringDuane is doing very well. He denies any chest pain or shortness of breath.  He's been taking one half of his Toprol-XL dose and feels very well.   Current Outpatient Medications on File Prior to Visit  Medication Sig Dispense Refill  . aspirin 81 MG tablet Take 81 mg by mouth daily.      Marland Kitchen. atorvastatin (LIPITOR) 40 MG tablet Take 40 mg by mouth daily.      Marland Kitchen. FLUOXETINE HCL PO Take by mouth daily. PATIENT IS UNSURE OF MILLIGRAM..    . metoprolol succinate (TOPROL-XL) 50 MG 24 hr tablet Take 25 mg by mouth daily. Take with or immediately following a meal.    . NIACIN PO Take 1,500 mg by mouth daily.     Marland Kitchen. omega-3 acid ethyl esters (LOVAZA) 1 G capsule Take 2 g by mouth daily. TAKE 2 TABLETS BY MOUTH DAILY    . Saw Palmetto, Serenoa repens, (SAW PALMETTO PO) Take by mouth daily. PATIENT IS UNSURE OF MILLIGRAMS..     No current facility-administered medications on file prior to visit.     Allergies  Allergen Reactions  . Prednisone     Causes a Rapid Heart Rate    Past Medical History:  Diagnosis Date  . Anxiety   . Dyslipidemia    with a low HDL  . Palpitations      Past Surgical History:  Procedure Laterality Date  . HERNIA REPAIR  1966  . US ECHOCARDIOGRAPHY  05-21-2006   Est EF 55-60%    Social History   Tobacco Use  Smoking Status Never Smoker  Smokeless Tobacco Never Used    Social History   Substance and Sexual Activity  Alcohol Use No    Family History  Problem Relation Age of Onset  . Coronary artery disease Father   . Heart disease Father   . Heart failure Father   . Hyperlipidemia Father   . Colon cancer Neg Hx   . Stomach cancer Neg Hx     Reviw of Systems:  Reviewed in the HPI.  All other systems are negative.  Physical Exam: BP 108/74   Pulse (!) 59   Ht 5\' 10"  (1.778 m)   Wt 205 lb 12.8 oz (93.4 kg)   BMI 29.53 kg/m  The patient is alert and oriented x 3.  The mood and affect are normal.   Skin: warm and dry.  Color is normal.   HEENT:   the sclera are nonicteric.  The mucous membranes are moist.  The carotids are 2+ without bruits.  There is no thyromegaly.  There is no JVD.   Lungs: clear.  The chest wall is non tender.   Heart: regular rate with a normal S1 and S2.  There are no murmurs, gallops, or rubs. The PMI is not displaced.    Abdomen: good bowel sounds.  There is no guarding or rebound.  There is no hepatosplenomegaly or tenderness.  There are no masses.  Extremities:  no clubbing, cyanosis, or edema.  The legs are without rashes.  The distal pulses are intact.  Neuro:  Cranial nerves II - XII are intact.  Motor and sensory functions are intact.    The gait is normal.  ECG: 02/25/2016: Sinus bradycardia at 57. EKG is otherwise normal. Assessment / Plan:   1. Dyslipidemia: Continue with current medications. We'll check fasting labs today. He exercises on a regular basis.  We'll see him again in one year. He eats a very low fat and low cholesterol diet. He exercises fairly regular.   Kristeen Miss, MD  05/18/2017 8:43 AM    [facility] [address] [place],  [address] [place], [place]  [address] Pager [phone] Phone: [phone]; Fax: [phone]     Andrew Harrell Date of Birth  [birth year] [facility] [address]     [address] [place], [place]  [address] [phone]  Fax  [phone]  Problem List 1. Dyslipidemia.    Andrew Harrell is a relatively healthy 57 year old gentleman with a history of dyslipidemia. He has a strong family history of coronary disease. He's felt very well. He's not had any episodes of chest pain or shortness of breath.  He's exercising on a regular basis and is able to do all of his normal activities without any limitations.  October 28, 2012:  Andrew Harrell has done well.  No CP or dyspnea.     November 17, 2013: Andrew Harrell is doing well.   Not exercising as much - still walks twice a week.    Sept. 6, 2016:  Andrew Harrell is doing well. No CP , Spent the weekend at the beach .  BP is normal   Oct. 20, 2017:  Andrew Harrell is doing very well. He denies any chest pain or shortness of breath.  He's been taking one half of his Toprol-XL dose and feels very well.  May 18, 2017: Doing well  BP is well controlled.     Exercises regularly .   Walks 2-3 times a week     Current Outpatient Medications on File Prior to Visit  Medication Sig Dispense Refill  . aspirin 81 MG tablet Take 81 mg by mouth daily.      Marland Kitchen atorvastatin (LIPITOR) 40 MG tablet Take 40 mg by mouth daily.      Marland Kitchen FLUOXETINE HCL PO Take by mouth daily. PATIENT IS UNSURE OF MILLIGRAM..    . metoprolol succinate (TOPROL-XL) 50 MG 24 hr tablet Take 25 mg by mouth daily. Take with or immediately following a meal.    . NIACIN PO Take 1,500 mg by mouth daily.     Marland Kitchen omega-3 acid ethyl esters (LOVAZA) 1 G capsule Take 2 g by mouth daily. TAKE 2 TABLETS BY MOUTH DAILY    . Saw Palmetto, Serenoa repens, (SAW PALMETTO PO) Take by mouth daily. PATIENT IS UNSURE OF MILLIGRAMS..     No current facility-administered medications on file prior  to visit.  Allergies  Allergen Reactions  . Prednisone     Causes a Rapid Heart Rate    Past Medical History:  Diagnosis Date  . Anxiety   . Dyslipidemia    with a low HDL  . Palpitations     Past Surgical History:  Procedure Laterality Date  . HERNIA REPAIR  1966  . US ECHOCARDIOGRAPHY  05-21-2006   Est EF 55-60%    Social History   Tobacco Use  Smoking Status Never Smoker  Smokeless Tobacco Never Used    Social History   Substance and Sexual Activity  Alcohol Use No    Family History  Problem Relation Age of Onset  . Coronary artery disease Father   . Heart disease Father   . Heart failure Father   . Hyperlipidemia Father   . Colon cancer Neg Hx   . Stomach cancer Neg Hx     Reviw of Systems:  Reviewed in the HPI.  All other systems are negative.  Physical Exam: Blood pressure 108/74, pulse (!) 59, height 5\' 10"  (1.778 m), weight 205 lb 12.8 oz (93.4 kg).  GEN:  Well nourished, well developed in no acute distress HEENT: Normal NECK: No JVD; No carotid bruits LYMPHATICS: No lymphadenopathy CARDIAC: RR, normal S1S2  RESPIRATORY:  Clear to auscultation without rales, wheezing or rhonchi  ABDOMEN: Soft, non-tender, non-distended MUSCULOSKELETAL:  No edema; No deformity  SKIN: Warm and dry NEUROLOGIC:  Alert and oriented x 3   ECG: May 18, 2017: Sinus bradycardia at 59.  EKG is otherwise normal.  Assessment / Plan:   1. Dyslipidemia: Continue atorvastatin 40 mg a day and omega-3 fatty acids.  I have encouraged him to exercise 3-4 times a week.  I will see him again in 1 year.  2.  HTN:   BP is well controlled.    Kristeen Miss, MD  05/18/2017 8:44 AM    [facility] [address] [place],  [address] [place], [place]  [address] Pager [phone] Phone: [phone]; Fax: [phone]

## 2017-05-18 NOTE — Patient Instructions (Signed)
Medication Instructions:  Your physician recommends that you continue on your current medications as directed. Please refer to the Current Medication list given to you today.   Labwork: TODAY - cholesterol, liver panel, basic metabolic panel   Testing/Procedures: None Ordered   Follow-Up: Your physician wants you to follow-up in: 1 year with Dr. Nahser.  You will receive a reminder letter in the mail two months in advance. If you don't receive a letter, please call our office to schedule the follow-up appointment.   If you need a refill on your cardiac medications before your next appointment, please call your pharmacy.   Thank you for choosing [facility]! Abir Eroh, RN [phone]    

## 2017-07-25 ENCOUNTER — Other Ambulatory Visit: Payer: Self-pay | Admitting: Cardiovascular Disease

## 2018-05-21 ENCOUNTER — Ambulatory Visit: Payer: PRIVATE HEALTH INSURANCE | Admitting: Cardiovascular Disease

## 2018-05-21 ENCOUNTER — Encounter: Payer: Self-pay | Admitting: Cardiovascular Disease

## 2018-05-21 VITALS — BP 110/72 | HR 58 | Ht 70.0 in | Wt 198.0 lb

## 2018-05-21 DIAGNOSIS — I10 Essential (primary) hypertension: Secondary | ICD-10-CM

## 2018-05-21 DIAGNOSIS — E782 Mixed hyperlipidemia: Secondary | ICD-10-CM

## 2018-05-21 LAB — LIPID PANEL
CHOL/HDL RATIO: 3.2 ratio (ref 0.0–5.0)
CHOLESTEROL TOTAL: 126 mg/dL (ref 100–199)
HDL: 39 mg/dL — ABNORMAL LOW (ref >39)
LDL CALC: 74 mg/dL (ref 0–99)
Triglycerides: 67 mg/dL (ref 0–149)
VLDL CHOLESTEROL CAL: 13 mg/dL (ref 5–40)

## 2018-05-21 LAB — BASIC METABOLIC PANEL
BUN/Creatinine Ratio: 8 — ABNORMAL LOW (ref 9–20)
BUN: 8 mg/dL (ref 6–24)
CALCIUM: 8.9 mg/dL (ref 8.7–10.2)
CHLORIDE: 104 mmol/L (ref 96–106)
CO2: 25 mmol/L (ref 20–29)
Creatinine, Ser: 1.05 mg/dL (ref 0.76–1.27)
GFR calc Af Amer: 91 mL/min/{1.73_m2} (ref >59)
GFR, EST NON AFRICAN AMERICAN: 78 mL/min/{1.73_m2} (ref >59)
GLUCOSE: 92 mg/dL (ref 65–99)
POTASSIUM: 4.3 mmol/L (ref 3.5–5.2)
SODIUM: 144 mmol/L (ref 134–144)

## 2018-05-21 LAB — HEPATIC FUNCTION PANEL
ALT: 26 IU/L (ref 0–44)
AST: 21 IU/L (ref 0–40)
Albumin: 3.9 g/dL (ref 3.5–5.5)
Alkaline Phosphatase: 36 IU/L — ABNORMAL LOW (ref 39–117)
Bilirubin Total: 0.6 mg/dL (ref 0.0–1.2)
Bilirubin, Direct: 0.16 mg/dL (ref 0.00–0.40)
Total Protein: 6.2 g/dL (ref 6.0–8.5)

## 2018-05-21 NOTE — Patient Instructions (Signed)
Medication Instructions:  Your physician recommends that you continue on your current medications as directed. Please refer to the Current Medication list given to you today.  If you need a refill on your cardiac medications before your next appointment, please call your pharmacy.   Lab work: TODAY - cholesterol, liver panel, basic metabolic panel  If you have labs (blood work) drawn today and your tests are completely normal, you will receive your results only by: . MyChart Message (if you have MyChart) OR . A paper copy in the mail If you have any lab test that is abnormal or we need to change your treatment, we will call you to review the results.  Testing/Procedures: None Ordered   Follow-Up: At [facility], you and your health needs are our priority.  As part of our continuing mission to provide you with exceptional heart care, we have created designated Provider Care Teams.  These Care Teams include your primary Cardiologist (physician) and Advanced Practice Providers (APPs -  Physician Assistants and Nurse Practitioners) who all work together to provide you with the care you need, when you need it. You will need a follow up appointment in:  1 years.  Please call our office 2 months in advance to schedule this appointment.  You may see Philip Nahser, MD or one of the following Advanced Practice Providers on your designated Care Team: Scott Weaver, PA-C Vin Bhagat, PA-C . Janine Hammond, NP     

## 2018-05-21 NOTE — Progress Notes (Signed)
Andrew Harrell Date of Birth  03-10-1961 [facility] [address]     [address] [place], [place]  [address] [phone]  Fax  [phone]  Problem List 1. Dyslipidemia.    Andrew RaringDuane is a relatively healthy 58 year old gentleman with a history of dyslipidemia. He has a strong family history of coronary disease. He's felt very well. He's not had any episodes of chest pain or shortness of breath.  He's exercising on a regular basis and is able to do all of his normal activities without any limitations.  October 28, 2012:  Andrew Harrell has done well.  No CP or dyspnea.     November 17, 2013: Andrew RaringDuane is doing well.   Not exercising as much - still walks twice a week.    Sept. 6, 2016:  Andrew RaringDuane is doing well. No CP , Spent the weekend at the beach .  BP is normal   Oct. 20, 2017:  Andrew RaringDuane is doing very well. He denies any chest pain or shortness of breath.  He's been taking one half of his Toprol-XL dose and feels very well.   Current Outpatient Medications on File Prior to Visit  Medication Sig Dispense Refill  . aspirin 81 MG tablet Take 81 mg by mouth daily.      Marland Kitchen. atorvastatin (LIPITOR) 40 MG tablet Take 40 mg by mouth daily.      Marland Kitchen. FLUOXETINE HCL PO Take by mouth daily. PATIENT IS UNSURE OF MILLIGRAM..    . metoprolol succinate (TOPROL-XL) 50 MG 24 hr tablet TAKE 1 TABLET BY MOUTH EVERY DAY WITH OR IMMEDIATELY FOLLOWING A MEAL 90 tablet 2  . NIACIN PO Take 1,500 mg by mouth daily.     Marland Kitchen. omega-3 acid ethyl esters (LOVAZA) 1 G capsule Take 2 g by mouth daily. TAKE 2 TABLETS BY MOUTH DAILY    . Saw Palmetto, Serenoa repens, (SAW PALMETTO PO) Take by mouth daily. PATIENT IS UNSURE OF MILLIGRAMS..     No current facility-administered medications on file prior to visit.     Allergies  Allergen Reactions  . Prednisone     Causes a Rapid Heart Rate    Past Medical History:  Diagnosis Date  . Anxiety   . Dyslipidemia    with a low HDL  .  Palpitations     Past Surgical History:  Procedure Laterality Date  . HERNIA REPAIR  1966  . US ECHOCARDIOGRAPHY  05-21-2006   Est EF 55-60%    Social History   Tobacco Use  Smoking Status Never Smoker  Smokeless Tobacco Never Used    Social History   Substance and Sexual Activity  Alcohol Use No    Family History  Problem Relation Age of Onset  . Coronary artery disease Father   . Heart disease Father   . Heart failure Father   . Hyperlipidemia Father   . Colon cancer Neg Hx   . Stomach cancer Neg Hx     Reviw of Systems:  Reviewed in the HPI.  All other systems are negative.  Physical Exam: BP 110/72   Pulse (!) 58   Ht 5\' 10"  (1.778 m)   Wt 198 lb (89.8 kg)   BMI 28.41 kg/m  The patient is alert and oriented x 3.  The mood and affect are normal.   Skin: warm and dry.  Color is normal.   HEENT:   the sclera are nonicteric.  The mucous membranes are moist.  The  carotids are 2+ without bruits.  There is no thyromegaly.  There is no JVD.   Lungs: clear.  The chest wall is non tender.   Heart: regular rate with a normal S1 and S2.  There are no murmurs, gallops, or rubs. The PMI is not displaced.    Abdomen: good bowel sounds.  There is no guarding or rebound.  There is no hepatosplenomegaly or tenderness.  There are no masses.  Extremities:  no clubbing, cyanosis, or edema.  The legs are without rashes.  The distal pulses are intact.  Neuro:  Cranial nerves II - XII are intact.  Motor and sensory functions are intact.    The gait is normal.  ECG: 02/25/2016: Sinus bradycardia at 57. EKG is otherwise normal. Assessment / Plan:   1. Dyslipidemia: Continue with current medications. We'll check fasting labs today. He exercises on a regular basis.  We'll see him again in one year. He eats a very low fat and low cholesterol diet. He exercises fairly regular.   Kristeen Miss, MD  05/21/2018 8:38 AM    [facility] [address] [place],   [address] [place], [place]  [address] Pager [phone] Phone: [phone]; Fax: [phone]     Andrew Harrell Date of Birth  07-25-1960 [facility] [address]     [address] [place], [place]  [address] [phone]  Fax  [phone]  Problem List 1. Dyslipidemia.    Andrew Harrell is a relatively healthy 58 year old gentleman with a history of dyslipidemia. He has a strong family history of coronary disease. He's felt very well. He's not had any episodes of chest pain or shortness of breath.  He's exercising on a regular basis and is able to do all of his normal activities without any limitations.  October 28, 2012:  Andrew Harrell has done well.  No CP or dyspnea.     November 17, 2013: Andrew Harrell is doing well.   Not exercising as much - still walks twice a week.    Sept. 6, 2016:  Andrew Harrell is doing well. No CP , Spent the weekend at the beach .  BP is normal   Oct. 20, 2017:  Andrew Harrell is doing very well. He denies any chest pain or shortness of breath.  He's been taking one half of his Toprol-XL dose and feels very well.  May 18, 2017: Doing well  BP is well controlled.     Exercises regularly .   Walks 2-3 times a week   May 21, 2018: Andrew Harrell  is seen today for follow-up of his hypertension and hyperlipidemia. Exercises regularly .    [place]  No CP , no dyspnea.      Current Outpatient Medications on File Prior to Visit  Medication Sig Dispense Refill  . aspirin 81 MG tablet Take 81 mg by mouth daily.      Marland Kitchen atorvastatin (LIPITOR) 40 MG tablet Take 40 mg by mouth daily.      Marland Kitchen FLUOXETINE HCL PO Take by mouth daily. PATIENT IS UNSURE OF MILLIGRAM..    . metoprolol succinate (TOPROL-XL) 50 MG 24 hr tablet TAKE 1 TABLET BY MOUTH EVERY DAY WITH OR IMMEDIATELY FOLLOWING A MEAL 90 tablet 2  . NIACIN PO Take 1,500 mg by mouth daily.     Marland Kitchen omega-3 acid ethyl esters (LOVAZA) 1 G capsule Take 2 g by mouth daily. TAKE 2  TABLETS BY MOUTH  DAILY    . Saw Palmetto, Serenoa repens, (SAW PALMETTO PO) Take by mouth daily. PATIENT IS UNSURE OF MILLIGRAMS..     No current facility-administered medications on file prior to visit.     Allergies  Allergen Reactions  . Prednisone     Causes a Rapid Heart Rate    Past Medical History:  Diagnosis Date  . Anxiety   . Dyslipidemia    with a low HDL  . Palpitations     Past Surgical History:  Procedure Laterality Date  . HERNIA REPAIR  1966  . US ECHOCARDIOGRAPHY  05-21-2006   Est EF 55-60%    Social History   Tobacco Use  Smoking Status Never Smoker  Smokeless Tobacco Never Used    Social History   Substance and Sexual Activity  Alcohol Use No    Family History  Problem Relation Age of Onset  . Coronary artery disease Father   . Heart disease Father   . Heart failure Father   . Hyperlipidemia Father   . Colon cancer Neg Hx   . Stomach cancer Neg Hx     Reviw of Systems:  Reviewed in the HPI.  All other systems are negative.  Physical Exam: Blood pressure 110/72, pulse (!) 58, height 5\' 10"  (1.778 m), weight 198 lb (89.8 kg).  GEN:  Well nourished, well developed in no acute distress HEENT: Normal NECK: No JVD; No carotid bruits LYMPHATICS: No lymphadenopathy CARDIAC: RRR  RESPIRATORY:  Clear to auscultation without rales, wheezing or rhonchi  ABDOMEN: Soft, non-tender, non-distended MUSCULOSKELETAL:  No edema; No deformity  SKIN: Warm and dry NEUROLOGIC:  Alert and oriented x 3   ECG: May 21, 2018: Sinus bradycardia 58 beats minute.  Otherwise normal EKG.  Assessment / Plan:   1. Dyslipidemia: Lipids from a year ago looked great.  We will check fasting lipids, liver enzymes, basic metabolic profile today.  Continue current medications.    2.  HTN:   BP is well controlled.  Continue diet, exercise, medications.  Kristeen Miss, MD  05/21/2018 8:38 AM    [facility] [address] [place],  [address] [place], [place]  [address] Pager [phone] Phone: [phone]; Fax: [phone]

## 2018-10-12 ENCOUNTER — Other Ambulatory Visit: Payer: Self-pay | Admitting: Cardiovascular Disease

## 2019-05-28 ENCOUNTER — Encounter: Payer: Self-pay | Admitting: Cardiovascular Disease

## 2019-05-28 ENCOUNTER — Other Ambulatory Visit: Payer: Self-pay

## 2019-05-28 ENCOUNTER — Ambulatory Visit: Payer: PRIVATE HEALTH INSURANCE | Admitting: Cardiovascular Disease

## 2019-05-28 VITALS — BP 118/80 | HR 66 | Ht 70.0 in | Wt 197.0 lb

## 2019-05-28 DIAGNOSIS — E785 Hyperlipidemia, unspecified: Secondary | ICD-10-CM | POA: Diagnosis not present

## 2019-05-28 DIAGNOSIS — I10 Essential (primary) hypertension: Secondary | ICD-10-CM | POA: Diagnosis not present

## 2019-05-28 LAB — BASIC METABOLIC PANEL
BUN/Creatinine Ratio: 9 (ref 9–20)
BUN: 10 mg/dL (ref 6–24)
CO2: 25 mmol/L (ref 20–29)
Calcium: 9.2 mg/dL (ref 8.7–10.2)
Chloride: 101 mmol/L (ref 96–106)
Creatinine, Ser: 1.07 mg/dL (ref 0.76–1.27)
GFR calc Af Amer: 88 mL/min/{1.73_m2} (ref >59)
GFR calc non Af Amer: 76 mL/min/{1.73_m2} (ref >59)
Glucose: 98 mg/dL (ref 65–99)
Potassium: 4.1 mmol/L (ref 3.5–5.2)
Sodium: 141 mmol/L (ref 134–144)

## 2019-05-28 LAB — LIPID PANEL
Chol/HDL Ratio: 3.5 ratio (ref 0.0–5.0)
Cholesterol, Total: 139 mg/dL (ref 100–199)
HDL: 40 mg/dL (ref >39)
LDL Chol Calc (NIH): 83 mg/dL (ref 0–99)
Triglycerides: 84 mg/dL (ref 0–149)
VLDL Cholesterol Cal: 16 mg/dL (ref 5–40)

## 2019-05-28 LAB — HEPATIC FUNCTION PANEL
ALT: 39 IU/L (ref 0–44)
AST: 26 IU/L (ref 0–40)
Albumin: 4.4 g/dL (ref 3.8–4.9)
Alkaline Phosphatase: 42 IU/L (ref 39–117)
Bilirubin Total: 0.6 mg/dL (ref 0.0–1.2)
Bilirubin, Direct: 0.17 mg/dL (ref 0.00–0.40)
Total Protein: 6.8 g/dL (ref 6.0–8.5)

## 2019-05-28 NOTE — Progress Notes (Signed)
Andrew Harrell Date of Birth  May 21, 1960 [facility] / [facility] [address]     [place],   [address] [phone]  Fax  [phone]  Problem List 1. Dyslipidemia.    Andrew Harrell is a relatively healthy 59 year old gentleman with a history of dyslipidemia. He has a strong family history of coronary disease. He's felt very well. He's not had any episodes of chest pain or shortness of breath.  He's exercising on a regular basis and is able to do all of his normal activities without any limitations.  October 28, 2012:  Andrew Harrell has done well.  No CP or dyspnea.     November 17, 2013: Andrew Harrell is doing well.   Not exercising as much - still walks twice a week.    Sept. 6, 2016:  Andrew Harrell is doing well. No CP , [place] the beach .  BP is normal   Oct. 20, 2017:  Andrew Harrell is doing very well. He denies any chest pain or shortness of breath.  He's been taking one half of his Toprol-XL dose and feels very well.   Current Outpatient Medications on File Prior to Visit  Medication Sig Dispense Refill  . aspirin 81 MG tablet Take 81 mg by mouth daily.      Marland Kitchen atorvastatin (LIPITOR) 40 MG tablet Take 40 mg by mouth daily.      Marland Kitchen FLUOXETINE HCL PO Take by mouth daily. PATIENT IS UNSURE OF MILLIGRAM..    . metoprolol succinate (TOPROL-XL) 50 MG 24 hr tablet TAKE 1 TABLET BY MOUTH EVERY DAY WITH OR IMMEDIATELY FOLLOWING A MEAL 90 tablet 1  . NIACIN PO Take 1,500 mg by mouth daily.     Marland Kitchen omega-3 acid ethyl esters (LOVAZA) 1 G capsule Take 2 g by mouth daily. TAKE 2 TABLETS BY MOUTH DAILY    . Saw Palmetto, Serenoa repens, (SAW PALMETTO PO) Take by mouth daily. PATIENT IS UNSURE OF MILLIGRAMS..     No current facility-administered medications on file prior to visit.    Allergies  Allergen Reactions  . Prednisone     Causes a Rapid Heart Rate    Past Medical History:  Diagnosis Date  . Anxiety   . Dyslipidemia    with a low HDL  . Palpitations      Past Surgical History:  Procedure Laterality Date  . HERNIA REPAIR  1966  . US ECHOCARDIOGRAPHY  05-21-2006   Est EF 55-60%    Social History   Tobacco Use  Smoking Status Never Smoker  Smokeless Tobacco Never Used    Social History   Substance and Sexual Activity  Alcohol Use No    Family History  Problem Relation Age of Onset  . Coronary artery disease Father   . Heart disease Father   . Heart failure Father   . Hyperlipidemia Father   . Colon cancer Neg Hx   . Stomach cancer Neg Hx     Reviw of Systems:  Reviewed in the HPI.  All other systems are negative.  Physical Exam: BP 118/80   Pulse 66   Ht 5\' 10"  (1.778 m)   Wt 197 lb (89.4 kg)   SpO2 98%   BMI 28.27 kg/m  The patient is alert and oriented x 3.  The mood and affect are normal.   Skin: warm and dry.  Color is normal.   HEENT:   the sclera are nonicteric.  The mucous membranes are moist.  The carotids are 2+ without bruits.  There is no thyromegaly.  There is no JVD.   Lungs: clear.  The chest wall is non tender.   Heart: regular rate with a normal S1 and S2.  There are no murmurs, gallops, or rubs. The PMI is not displaced.    Abdomen: good bowel sounds.  There is no guarding or rebound.  There is no hepatosplenomegaly or tenderness.  There are no masses.  Extremities:  no clubbing, cyanosis, or edema.  The legs are without rashes.  The distal pulses are intact.  Neuro:  Cranial nerves II - XII are intact.  Motor and sensory functions are intact.    The gait is normal.  ECG: 02/25/2016: Sinus bradycardia at 57. EKG is otherwise normal. Assessment / Plan:   1. Dyslipidemia: Continue with current medications. We'll check fasting labs today. He exercises on a regular basis.  We'll see him again in one year. He eats a very low fat and low cholesterol diet. He exercises fairly regular.   Andrew Miss, MD  05/28/2019 9:36 AM    [facility] [address] [place],  [address] [place], [place]  [address] Pager [phone] Phone: [phone]; Fax: [phone]     Andrew Harrell Date of Birth  1961-04-29 [facility] / [facility] [address]     [address] [place], [place]  [address] [phone]  Fax  [phone]  Problem List 1. Dyslipidemia.    Andrew Harrell is a relatively healthy 59 year old gentleman with a history of dyslipidemia. He has a strong family history of coronary disease. He's felt very well. He's not had any episodes of chest pain or shortness of breath.  He's exercising on a regular basis and is able to do all of his normal activities without any limitations.  October 28, 2012:  Andrew Harrell has done well.  No CP or dyspnea.     November 17, 2013: Andrew Harrell is doing well.   Not exercising as much - still walks twice a week.    Sept. 6, 2016:  Andrew Harrell is doing well. No CP , Spent the weekend at the beach .  BP is normal   Oct. 20, 2017:  Andrew Harrell is doing very well. He denies any chest pain or shortness of breath.  He's been taking one half of his Toprol-XL dose and feels very well.  May 18, 2017: Doing well  BP is well controlled.     Exercises regularly .   Walks 2-3 times a week   May 21, 2018: Andrew Harrell  is seen today for follow-up of his hypertension and hyperlipidemia. Exercises regularly .    [place]  No CP , no dyspnea.   May 28, 2019:  Andrew Harrell is seen today for follow-up of his hypertension and hyperlipidemia. No cp , no dyspnea Trying to stay active.  Sticks to a good diet    Current Outpatient Medications on File Prior to Visit  Medication Sig Dispense Refill  . aspirin 81 MG tablet Take 81 mg by mouth daily.      Marland Kitchen atorvastatin (LIPITOR) 40 MG tablet Take 40 mg by mouth daily.      Marland Kitchen FLUOXETINE HCL PO Take by mouth daily. PATIENT IS UNSURE OF MILLIGRAM..    . metoprolol succinate (TOPROL-XL) 50 MG 24 hr tablet TAKE 1 TABLET BY MOUTH EVERY DAY WITH OR IMMEDIATELY  FOLLOWING A MEAL 90 tablet 1  . NIACIN  PO Take 1,500 mg by mouth daily.     Marland Kitchen omega-3 acid ethyl esters (LOVAZA) 1 G capsule Take 2 g by mouth daily. TAKE 2 TABLETS BY MOUTH DAILY    . Saw Palmetto, Serenoa repens, (SAW PALMETTO PO) Take by mouth daily. PATIENT IS UNSURE OF MILLIGRAMS..     No current facility-administered medications on file prior to visit.    Allergies  Allergen Reactions  . Prednisone     Causes a Rapid Heart Rate    Past Medical History:  Diagnosis Date  . Anxiety   . Dyslipidemia    with a low HDL  . Palpitations     Past Surgical History:  Procedure Laterality Date  . HERNIA REPAIR  1966  . US ECHOCARDIOGRAPHY  05-21-2006   Est EF 55-60%    Social History   Tobacco Use  Smoking Status Never Smoker  Smokeless Tobacco Never Used    Social History   Substance and Sexual Activity  Alcohol Use No    Family History  Problem Relation Age of Onset  . Coronary artery disease Father   . Heart disease Father   . Heart failure Father   . Hyperlipidemia Father   . Colon cancer Neg Hx   . Stomach cancer Neg Hx     Reviw of Systems:  Reviewed in the HPI.  All other systems are negative.   Physical Exam: Blood pressure 118/80, pulse 66, height 5\' 10"  (1.778 m), weight 197 lb (89.4 kg), SpO2 98 %.  GEN:  Well nourished, well developed in no acute distress HEENT: Normal NECK: No JVD; No carotid bruits LYMPHATICS: No lymphadenopathy CARDIAC: RRR , no murmurs, rubs, gallops RESPIRATORY:  Clear to auscultation without rales, wheezing or rhonchi  ABDOMEN: Soft, non-tender, non-distended MUSCULOSKELETAL:  No edema; No deformity  SKIN: Warm and dry NEUROLOGIC:  Alert and oriented x 3   ECG: May 28, 2019: Normal sinus rhythm at 66.  Nonspecific ST and T wave abnormalities. Assessment / Plan:   1. Dyslipidemia: Lipid levels look good. His HDL remains a little bit low. Continue medications. His goal LDL is 70. We will continue to be  aggressive with his lipid-lowering given his family history of premature coronary artery disease.    2.  HTN:   Blood pressure is well controlled. Continue current occasions.  May 30, 2019, MD  05/28/2019 9:36 AM    [facility] [address] [place],  [address] [place], [place]  [place] Pager [phone] Phone: [phone]; Fax: [phone]

## 2019-05-28 NOTE — Patient Instructions (Signed)
Medication Instructions:  Your physician recommends that you continue on your current medications as directed. Please refer to the Current Medication list given to you today.  *If you need a refill on your cardiac medications before your next appointment, please call your pharmacy*  Lab Work: BMET, Liver and Lipid today  If you have labs (blood work) drawn today and your tests are completely normal, you will receive your results only by: Marland Kitchen MyChart Message (if you have MyChart) OR . A paper copy in the mail If you have any lab test that is abnormal or we need to change your treatment, we will call you to review the results.  Testing/Procedures: None  Follow-Up: At [facility], you and your health needs are our priority.  As part of our continuing mission to provide you with exceptional heart care, we have created designated Provider Care Teams.  These Care Teams include your primary Cardiologist (physician) and Advanced Practice Providers (APPs -  Physician Assistants and Nurse Practitioners) who all work together to provide you with the care you need, when you need it.  Your next appointment:   12 month(s)  The format for your next appointment:   In Person  Provider:   You will see one of the following Advanced Practice Providers on your designated Care Team:    Tereso Newcomer, PA-C  Vin Kula, [place]  Berton Bon, NP   Other Instructions

## 2019-05-29 ENCOUNTER — Telehealth: Payer: Self-pay | Admitting: Nurse Practitioner

## 2019-05-29 DIAGNOSIS — E785 Hyperlipidemia, unspecified: Secondary | ICD-10-CM

## 2019-05-29 DIAGNOSIS — E782 Mixed hyperlipidemia: Secondary | ICD-10-CM

## 2019-05-29 DIAGNOSIS — I10 Essential (primary) hypertension: Secondary | ICD-10-CM

## 2019-05-29 MED ORDER — ROSUVASTATIN CALCIUM 10 MG PO TABS: 10.0000 mg | ORAL_TABLET | Freq: Every day | ORAL | 3 refills | Status: DC

## 2019-05-29 NOTE — Telephone Encounter (Signed)
-----   Message from Vesta Mixer, MD sent at 05/28/2019  5:44 PM EST ----- HDL is up to 40 which is better.   LDL is 83.   Very close to his goal of 70  Lets DC atorvastatin and start rosuvastatin 10 mg to see if her can achieve an LDL of 70 .

## 2019-05-29 NOTE — Telephone Encounter (Signed)
Prescription sent for rosuvastatin 10 mg and message to patient regarding changes and to call with questions. Advised patient to d/c atorvastatin.  Patient scheduled for lab appointment in April for recheck.

## 2019-06-12 ENCOUNTER — Other Ambulatory Visit: Payer: Self-pay | Admitting: Nurse Practitioner

## 2019-06-12 MED ORDER — ROSUVASTATIN CALCIUM 10 MG PO TABS: 10.0000 mg | ORAL_TABLET | Freq: Every day | ORAL | 3 refills | Status: DC

## 2019-08-28 ENCOUNTER — Other Ambulatory Visit: Payer: PRIVATE HEALTH INSURANCE

## 2019-11-16 ENCOUNTER — Other Ambulatory Visit: Payer: Self-pay | Admitting: Cardiovascular Disease

## 2019-11-27 ENCOUNTER — Other Ambulatory Visit: Payer: Self-pay

## 2019-11-27 ENCOUNTER — Other Ambulatory Visit: Payer: PRIVATE HEALTH INSURANCE | Admitting: *Deleted

## 2019-11-27 DIAGNOSIS — E782 Mixed hyperlipidemia: Secondary | ICD-10-CM

## 2019-11-27 DIAGNOSIS — I10 Essential (primary) hypertension: Secondary | ICD-10-CM

## 2019-11-27 LAB — LIPID PANEL
Chol/HDL Ratio: 3.9 ratio (ref 0.0–5.0)
Cholesterol, Total: 136 mg/dL (ref 100–199)
HDL: 35 mg/dL — ABNORMAL LOW (ref >39)
LDL Chol Calc (NIH): 87 mg/dL (ref 0–99)
Triglycerides: 71 mg/dL (ref 0–149)
VLDL Cholesterol Cal: 14 mg/dL (ref 5–40)

## 2019-11-27 LAB — HEPATIC FUNCTION PANEL
ALT: 15 IU/L (ref 0–44)
AST: 21 IU/L (ref 0–40)
Albumin: 4 g/dL (ref 3.8–4.9)
Alkaline Phosphatase: 34 IU/L — ABNORMAL LOW (ref 48–121)
Bilirubin Total: 0.3 mg/dL (ref 0.0–1.2)
Bilirubin, Direct: 0.12 mg/dL (ref 0.00–0.40)
Total Protein: 6 g/dL (ref 6.0–8.5)

## 2019-12-02 ENCOUNTER — Telehealth: Payer: Self-pay | Admitting: Cardiovascular Disease

## 2019-12-02 NOTE — Telephone Encounter (Signed)
The patient has been notified of the result and verbalized understanding.  All questions (if any) were answered. Lajoyce Corners, CMA 12/02/2019 8:31 AM

## 2019-12-02 NOTE — Telephone Encounter (Signed)
Andrew Harrell is returning Carol's call in regards to his lab results.

## 2020-06-02 ENCOUNTER — Ambulatory Visit (INDEPENDENT_AMBULATORY_CARE_PROVIDER_SITE_OTHER): Payer: PRIVATE HEALTH INSURANCE | Admitting: Physician Assistant

## 2020-06-02 ENCOUNTER — Other Ambulatory Visit: Payer: Self-pay

## 2020-06-02 ENCOUNTER — Encounter: Payer: Self-pay | Admitting: Physician Assistant

## 2020-06-02 VITALS — BP 102/68 | HR 54 | Ht 70.0 in | Wt 199.4 lb

## 2020-06-02 DIAGNOSIS — R002 Palpitations: Secondary | ICD-10-CM | POA: Diagnosis not present

## 2020-06-02 DIAGNOSIS — E785 Hyperlipidemia, unspecified: Secondary | ICD-10-CM

## 2020-06-02 NOTE — Patient Instructions (Signed)
Medication Instructions:  Your physician recommends that you continue on your current medications as directed. Please refer to the Current Medication list given to you today.  *If you need a refill on your cardiac medications before your next appointment, please call your pharmacy*   Lab Work: None ordered  If you have labs (blood work) drawn today and your tests are completely normal, you will receive your results only by: . MyChart Message (if you have MyChart) OR . A paper copy in the mail If you have any lab test that is abnormal or we need to change your treatment, we will call you to review the results.   Testing/Procedures: None ordered   Follow-Up: At [facility], you and your health needs are our priority.  As part of our continuing mission to provide you with exceptional heart care, we have created designated Provider Care Teams.  These Care Teams include your primary Cardiologist (physician) and Advanced Practice Providers (APPs -  Physician Assistants and Nurse Practitioners) who all work together to provide you with the care you need, when you need it.  We recommend signing up for the patient portal called "MyChart".  Sign up information is provided on this After Visit Summary.  MyChart is used to connect with patients for Virtual Visits (Telemedicine).  Patients are able to view lab/test results, encounter notes, upcoming appointments, etc.  Non-urgent messages can be sent to your provider as well.   To learn more about what you can do with MyChart, go to [email or web address].    Your next appointment:   12 month(s)  The format for your next appointment:   In Person  Provider:   You may see Philip Nahser, MD or one of the following Advanced Practice Providers on your designated Care Team:    Scott Weaver, PA-C  Vin Bhagat, PA-C    Other Instructions   

## 2020-06-02 NOTE — Progress Notes (Signed)
Cardiology Office Note:    Date:  06/02/2020   ID:  Andrew Harrell, DOB [birth year], MRN [id number]  PCP:  Jarome Matin, MD  [facility] Cardiologist:  Kristeen Miss, MD  [facility] Electrophysiologist:  None   Chief Complaint: yearly follow up   History of Present Illness:    Andrew Harrell is a 60 y.o. male with a hx of hyperlipidemia, palpitations and strong family hx of CAD presents for yearly follow up.   At baseline patient is very active and does exercise.   Here today for follow up. Patient reported remote history of palpitation greater than 10 years ago and since then he is Toprol-XL. No recurrence. He walks 2 miles every single day sometimes on incline. No exertional chest pain or shortness of breath. He denies palpitation, dizziness, orthopnea, PND, syncope, lower extremity edema or melena.  Past Medical History:  Diagnosis Date  . Anxiety   . Dyslipidemia    with a low HDL  . Palpitations     Past Surgical History:  Procedure Laterality Date  . HERNIA REPAIR  1966  . US ECHOCARDIOGRAPHY  05-21-2006   Est EF 55-60%    Current Medications: Current Meds  Medication Sig  . aspirin 81 MG tablet Take 81 mg by mouth daily.  Marland Kitchen FLUOXETINE HCL PO Take by mouth daily. PATIENT IS UNSURE OF MILLIGRAM..  . metoprolol succinate (TOPROL-XL) 50 MG 24 hr tablet TAKE 1 TABLET BY MOUTH EVERY DAY WITH OR IMMEDIATELY FOLLOWING A MEAL  . rosuvastatin (CRESTOR) 10 MG tablet Take 1 tablet (10 mg total) by mouth daily.     Allergies:   Prednisone   Social History   Socioeconomic History  . Marital status: Single    Spouse name: Not on file  . Number of children: Not on file  . Years of education: Not on file  . Highest education level: Not on file  Occupational History  . Not on file  Tobacco Use  . Smoking status: Never Smoker  . Smokeless tobacco: Never Used  Substance and Sexual Activity  . Alcohol use: No  . Drug use: No  . Sexual activity: Not on file   Other Topics Concern  . Not on file  Social History Narrative  . Not on file   Social Determinants of Health   Financial Resource Strain: Not on file  Food Insecurity: Not on file  Transportation Needs: Not on file  Physical Activity: Not on file  Stress: Not on file  Social Connections: Not on file     Family History: The patient's family history includes Coronary artery disease in his father; Heart disease in his father; Heart failure in his father; Hyperlipidemia in his father. There is no history of Colon cancer or Stomach cancer.    ROS:   Please see the history of present illness.    All other systems reviewed and are negative.   EKGs/Labs/Other Studies Reviewed:    The following studies were reviewed today: As summarized above  EKG:  EKG is  ordered today.  The ekg ordered today demonstrates sinus bradycardia at rate of 54 bpm  Recent Labs: 11/27/2019: ALT 15  Recent Lipid Panel    Component Value Date/Time   CHOL 136 11/27/2019 0726   TRIG 71 11/27/2019 0726   HDL 35 (L) 11/27/2019 0726   CHOLHDL 3.9 11/27/2019 0726   CHOLHDL 2.4 02/25/2016 1026   VLDL 11 02/25/2016 1026   LDLCALC 87 11/27/2019 0726    Physical Exam:  VS:  BP 102/68   Pulse (!) 54   Ht 5\' 10"  (1.778 m)   Wt 199 lb 6.4 oz (90.4 kg)   SpO2 96%   BMI 28.61 kg/m     Wt Readings from Last 3 Encounters:  06/02/20 199 lb 6.4 oz (90.4 kg)  05/28/19 197 lb (89.4 kg)  05/21/18 198 lb (89.8 kg)     GEN: Well nourished, well developed in no acute distress HEENT: Normal NECK: No JVD; No carotid bruits LYMPHATICS: No lymphadenopathy CARDIAC: RRR, no murmurs, rubs, gallops RESPIRATORY:  Clear to auscultation without rales, wheezing or rhonchi  ABDOMEN: Soft, non-tender, non-distended MUSCULOSKELETAL:  No edema; No deformity  SKIN: Warm and dry NEUROLOGIC:  Alert and oriented x 3 PSYCHIATRIC:  Normal affect   ASSESSMENT AND PLAN:    1. Hyperlipidemia -Continue Crestor 10 mg daily.  Continue exercise regimen and low-fat diet. He has follow-up with PCP in the next week and planning to check his lab work at that time.  2. Remote history of palpitation -Continue beta-blocker. No recurrence.  Medication Adjustments/Labs and Tests Ordered: Current medicines are reviewed at length with the patient today.  Concerns regarding medicines are outlined above.  Orders Placed This Encounter  Procedures  . EKG 12-Lead   No orders of the defined types were placed in this encounter.   Patient Instructions  Medication Instructions:  Your physician recommends that you continue on your current medications as directed. Please refer to the Current Medication list given to you today.  *If you need a refill on your cardiac medications before your next appointment, please call your pharmacy*   Lab Work: None ordered  If you have labs (blood work) drawn today and your tests are completely normal, you will receive your results only by: 05/23/18 MyChart Message (if you have MyChart) OR . A paper copy in the mail If you have any lab test that is abnormal or we need to change your treatment, we will call you to review the results.   Testing/Procedures: None ordered   Follow-Up: At [facility], you and your health needs are our priority.  As part of our continuing mission to provide you with exceptional heart care, we have created designated Provider Care Teams.  These Care Teams include your primary Cardiologist (physician) and Advanced Practice Providers (APPs -  Physician Assistants and Nurse Practitioners) who all work together to provide you with the care you need, when you need it.  We recommend signing up for the patient portal called "MyChart".  Sign up information is provided on this After Visit Summary.  MyChart is used to connect with patients for Virtual Visits (Telemedicine).  Patients are able to view lab/test results, encounter notes, upcoming appointments, etc.  Non-urgent  messages can be sent to your provider as well.   To learn more about what you can do with MyChart, go to [facility].    Your next appointment:   12 month(s)  The format for your next appointment:   In Person  Provider:   You may see [email or web address], MD or one of the following Advanced Practice Providers on your designated Care Team:    Kristeen Miss, PA-C  Tereso Newcomer, PA-C    Other Instructions      Signed, Chelsea Aus, Manson Passey  06/02/2020 8:39 AM    [facility]

## 2020-12-05 ENCOUNTER — Other Ambulatory Visit: Payer: Self-pay | Admitting: Cardiovascular Disease

## 2021-04-08 ENCOUNTER — Other Ambulatory Visit: Payer: Self-pay | Admitting: Cardiovascular Disease

## 2021-08-12 ENCOUNTER — Other Ambulatory Visit: Payer: Self-pay | Admitting: Cardiovascular Disease

## 2021-08-30 ENCOUNTER — Encounter: Payer: Self-pay | Admitting: Cardiovascular Disease

## 2021-09-05 ENCOUNTER — Ambulatory Visit: Payer: PRIVATE HEALTH INSURANCE | Admitting: Cardiovascular Disease

## 2021-09-05 ENCOUNTER — Encounter: Payer: Self-pay | Admitting: Cardiovascular Disease

## 2021-09-05 VITALS — BP 108/72 | HR 60 | Ht 70.0 in | Wt 186.8 lb

## 2021-09-05 DIAGNOSIS — I10 Essential (primary) hypertension: Secondary | ICD-10-CM | POA: Diagnosis not present

## 2021-09-05 DIAGNOSIS — E785 Hyperlipidemia, unspecified: Secondary | ICD-10-CM

## 2021-09-05 LAB — LIPID PANEL
Chol/HDL Ratio: 3.6 ratio (ref 0.0–5.0)
Cholesterol, Total: 135 mg/dL (ref 100–199)
HDL: 38 mg/dL — ABNORMAL LOW (ref >39)
LDL Chol Calc (NIH): 82 mg/dL (ref 0–99)
Triglycerides: 72 mg/dL (ref 0–149)
VLDL Cholesterol Cal: 15 mg/dL (ref 5–40)

## 2021-09-05 LAB — HEPATIC FUNCTION PANEL
ALT: 21 IU/L (ref 0–44)
AST: 22 IU/L (ref 0–40)
Albumin: 4.2 g/dL (ref 3.8–4.9)
Alkaline Phosphatase: 34 IU/L — ABNORMAL LOW (ref 44–121)
Bilirubin Total: 0.7 mg/dL (ref 0.0–1.2)
Bilirubin, Direct: 0.2 mg/dL (ref 0.00–0.40)
Total Protein: 6.2 g/dL (ref 6.0–8.5)

## 2021-09-05 LAB — BASIC METABOLIC PANEL
BUN/Creatinine Ratio: 11 (ref 10–24)
BUN: 12 mg/dL (ref 8–27)
CO2: 28 mmol/L (ref 20–29)
Calcium: 9.3 mg/dL (ref 8.6–10.2)
Chloride: 104 mmol/L (ref 96–106)
Creatinine, Ser: 1.07 mg/dL (ref 0.76–1.27)
Glucose: 94 mg/dL (ref 70–99)
Potassium: 4.2 mmol/L (ref 3.5–5.2)
Sodium: 141 mmol/L (ref 134–144)
eGFR: 79 mL/min/{1.73_m2} (ref >59)

## 2021-09-05 NOTE — Progress Notes (Signed)
? ? ?Andrew Harrell ?Date of Birth  25-Jul-1960 ?[facility] / [facility] ?[address]     [address] ?[place], [place]  [address] ?[phone]  Fax  [phone] ? ?Problem List ?1. Dyslipidemia. ? ? ? ?Andrew Harrell is a relatively healthy 61 year old gentleman with a history of dyslipidemia. He has a strong family history of coronary disease. He's felt very well. He's not had any episodes of chest pain or shortness of breath.  He's exercising on a regular basis and is able to do all of his normal activities without any limitations. ? ?October 28, 2012: ? ?Andrew Harrell has done well.  No CP or dyspnea.   ? ? ?November 17, 2013: ?Andrew Harrell is doing well.   Not exercising as much - still walks twice a week.   ? ?Sept. 6, 2016: ? ?Andrew Harrell is doing well. ?No CP , ?[place] the beach .  ?BP is normal  ? ?Oct. 20, 2017: ? ?Andrew Harrell is doing very well. He denies any chest pain or shortness of breath.  He's been taking one half of his Toprol-XL dose and feels very well. ? ?May 18, 2017: ?Doing well  ?BP is well controlled.     ?Exercises regularly .   Walks 2-3 times a week  ? ?May 21, 2018: ?Andrew Harrell  is seen today for follow-up of his hypertension and hyperlipidemia. ?Exercises regularly .    ?Gets to [employer] fairly often  ?No CP , no dyspnea.  ? ?May 28, 2019:  ?Andrew Harrell is seen today for follow-up of his hypertension and hyperlipidemia. ?No cp , no dyspnea ?Trying to stay active.  ?Sticks to a good diet ? ?Sep 05, 2021: ?Andrew Harrell is seen today for follow-up of his hypertension and hyperlipidemia. ? ?His significant other , Andrew Harrell is in the hop\spital  with NSTEMI.   Doing better . ? ?No CP,  no dyspnea  ? ? ?Current Outpatient Medications on File Prior to Visit  ?Medication Sig Dispense Refill  ? aspirin 81 MG tablet Take 81 mg by mouth daily.    ? FLUoxetine (PROZAC) 20 MG capsule Take 20 mg by mouth daily.    ? metoprolol succinate (TOPROL-XL) 50 MG 24 hr tablet TAKE 1 TABLET BY MOUTH EVERY DAY WITH  OR IMMEDIATELY FOLLOWING A MEAL. Please keep upcoming appt in May 2023 with Dr. Elease Hashimoto before anymore refills. Thank you Final attempt 30 tablet 0  ? rosuvastatin (CRESTOR) 10 MG tablet Take 1 tablet (10 mg total) by mouth daily. 90 tablet 3  ? ?No current facility-administered medications on file prior to visit.  ? ? ?Allergies  ?Allergen Reactions  ? Prednisone   ?  Causes a Rapid Heart Rate  ? ? ?Past Medical History:  ?Diagnosis Date  ? Anxiety   ? Dyslipidemia   ? with a low HDL  ? Palpitations   ? ? ?Past Surgical History:  ?Procedure Laterality Date  ? HERNIA REPAIR  1966  ? US ECHOCARDIOGRAPHY  05-21-2006  ? Est EF 55-60%  ? ? ?Social History  ? ?Tobacco Use  ?Smoking Status Never  ?Smokeless Tobacco Never  ? ? ?Social History  ? ?Substance and Sexual Activity  ?Alcohol Use No  ? ? ?Family History  ?Problem Relation Age of Onset  ? Coronary artery disease Father   ? Heart disease Father   ? Heart failure Father   ? Hyperlipidemia Father   ? Colon cancer Neg Hx   ? Stomach cancer Neg Hx   ? ? ?  Reviw of Systems:  ?Reviewed in the HPI.  All other systems are negative. ? ? ?Physical Exam: ?Blood pressure 108/72, pulse 60, height 5\' 10"  (1.778 m), weight 186 lb 12.8 oz (84.7 kg), SpO2 98 %. ? ?GEN:  Well nourished, well developed in no acute distress ?HEENT: Normal ?NECK: No JVD; No carotid bruits ?LYMPHATICS: No lymphadenopathy ?CARDIAC: RRR   ?RESPIRATORY:  Clear to auscultation without rales, wheezing or rhonchi  ?ABDOMEN: Soft, non-tender, non-distended ?MUSCULOSKELETAL:  No edema; No deformity  ?SKIN: Warm and dry ?NEUROLOGIC:  Alert and oriented x 3 ? ? ? ?ECG: Sep 05, 2021: Normal sinus rhythm at 60.  Normal EKG. ? ?Assessment / Plan:  ? ?1. Dyslipidemia: His last LDL was 127.  We will recheck lipids today.  Given his family history I would like to do a coronary calcium score.  If his coronary calcium score is significant then it would be prudent for Sep 07, 2021 to start one of the injectable medication such as a  PCSK9 inhibitor or inclisiran. ?  ? ?2.  HTN: Blood pressure is well controlled. ? ?[place], MD  ?09/05/2021 9:18 AM    ?[facility] ?[address],  [address] ?[place], [place]  [place] ?Pager [phone] ?Phone: [phone]; Fax: [phone]  ? ? ? ? ?

## 2021-09-05 NOTE — Patient Instructions (Signed)
Medication Instructions:  ?Your physician recommends that you continue on your current medications as directed. Please refer to the Current Medication list given to you today. ? ?*If you need a refill on your cardiac medications before your next appointment, please call your pharmacy* ? ? ?Lab Work: ?TODAY: FLP, ALT, BMET ? ?If you have labs (blood work) drawn today and your tests are completely normal, you will receive your results only by: ?MyChart Message (if you have MyChart) OR ?A paper copy in the mail ?If you have any lab test that is abnormal or we need to change your treatment, we will call you to review the results. ? ? ?Testing/Procedures: ?Coronary Calcium Score ? ? ?Follow-Up: ?At [facility], you and your health needs are our priority.  As part of our continuing mission to provide you with exceptional heart care, we have created designated Provider Care Teams.  These Care Teams include your primary Cardiologist (physician) and Advanced Practice Providers (APPs -  Physician Assistants and Nurse Practitioners) who all work together to provide you with the care you need, when you need it. ? ? ?Your next appointment:   ?1 year(s) ? ?The format for your next appointment:   ?In Person ? ?Provider:   ?Kristeen Miss, MD   ?  ?

## 2021-10-14 ENCOUNTER — Other Ambulatory Visit: Payer: Self-pay | Admitting: Cardiovascular Disease

## 2021-10-24 ENCOUNTER — Ambulatory Visit
Admission: RE | Admit: 2021-10-24 | Discharge: 2021-10-24 | Disposition: A | Discharge status: Home / Self Care | Payer: Self-pay | Source: Ambulatory Visit | Attending: Cardiovascular Disease | Admitting: Cardiovascular Disease

## 2021-10-24 DIAGNOSIS — E785 Hyperlipidemia, unspecified: Secondary | ICD-10-CM

## 2021-10-25 ENCOUNTER — Encounter: Payer: Self-pay | Admitting: Cardiovascular Disease

## 2021-10-25 DIAGNOSIS — E785 Hyperlipidemia, unspecified: Secondary | ICD-10-CM

## 2021-10-26 MED ORDER — EZETIMIBE 10 MG PO TABS: 10.0000 mg | ORAL_TABLET | Freq: Every day | ORAL | 3 refills | Status: DC

## 2021-10-31 ENCOUNTER — Encounter: Payer: Self-pay | Admitting: Gastroenterology

## 2021-11-17 ENCOUNTER — Encounter: Payer: Self-pay | Admitting: Cardiovascular Disease

## 2021-11-17 MED ORDER — METOPROLOL SUCCINATE ER 50 MG PO TB24: ORAL_TABLET | ORAL | 3 refills | Status: DC

## 2022-01-23 ENCOUNTER — Ambulatory Visit: Payer: PRIVATE HEALTH INSURANCE | Attending: Cardiovascular Disease

## 2022-01-23 DIAGNOSIS — E785 Hyperlipidemia, unspecified: Secondary | ICD-10-CM

## 2022-01-23 LAB — LIPID PANEL
Chol/HDL Ratio: 3.1 ratio (ref 0.0–5.0)
Cholesterol, Total: 114 mg/dL (ref 100–199)
HDL: 37 mg/dL — ABNORMAL LOW (ref >39)
LDL Chol Calc (NIH): 63 mg/dL (ref 0–99)
Triglycerides: 66 mg/dL (ref 0–149)
VLDL Cholesterol Cal: 14 mg/dL (ref 5–40)

## 2022-02-16 ENCOUNTER — Encounter: Payer: Self-pay | Admitting: Cardiovascular Disease

## 2022-09-26 ENCOUNTER — Other Ambulatory Visit: Payer: Self-pay | Admitting: Internal Medicine

## 2022-09-26 DIAGNOSIS — R918 Other nonspecific abnormal finding of lung field: Secondary | ICD-10-CM

## 2022-10-23 ENCOUNTER — Other Ambulatory Visit: Payer: Self-pay | Admitting: *Deleted

## 2022-10-23 MED ORDER — EZETIMIBE 10 MG PO TABS: 10.0000 mg | ORAL_TABLET | Freq: Every day | ORAL | 0 refills | Status: DC

## 2022-11-29 ENCOUNTER — Other Ambulatory Visit: Payer: Self-pay | Admitting: Internal Medicine

## 2022-11-29 DIAGNOSIS — R918 Other nonspecific abnormal finding of lung field: Secondary | ICD-10-CM

## 2022-12-06 ENCOUNTER — Other Ambulatory Visit: Payer: Self-pay | Admitting: Cardiovascular Disease

## 2022-12-06 ENCOUNTER — Other Ambulatory Visit: Payer: PRIVATE HEALTH INSURANCE

## 2022-12-07 ENCOUNTER — Other Ambulatory Visit: Payer: Self-pay

## 2022-12-07 MED ORDER — METOPROLOL SUCCINATE ER 50 MG PO TB24: ORAL_TABLET | ORAL | 0 refills | Status: DC

## 2022-12-13 ENCOUNTER — Other Ambulatory Visit: Payer: PRIVATE HEALTH INSURANCE

## 2022-12-15 ENCOUNTER — Other Ambulatory Visit: Payer: PRIVATE HEALTH INSURANCE

## 2022-12-18 ENCOUNTER — Encounter: Payer: Self-pay | Admitting: Cardiovascular Disease

## 2022-12-19 ENCOUNTER — Ambulatory Visit: Admission: RE | Admit: 2022-12-19 | Payer: PRIVATE HEALTH INSURANCE | Source: Ambulatory Visit

## 2022-12-19 DIAGNOSIS — R918 Other nonspecific abnormal finding of lung field: Secondary | ICD-10-CM

## 2022-12-21 MED ORDER — EZETIMIBE 10 MG PO TABS: 10.0000 mg | ORAL_TABLET | Freq: Every day | ORAL | 0 refills | Status: DC

## 2022-12-26 MED ORDER — EZETIMIBE 10 MG PO TABS: 10.0000 mg | ORAL_TABLET | Freq: Every day | ORAL | 0 refills | Status: DC

## 2022-12-26 NOTE — Addendum Note (Signed)
Addended by: Margaret Pyle D on: 12/26/2022 10:35 AM   Modules accepted: Orders

## 2023-01-15 ENCOUNTER — Encounter: Payer: Self-pay | Admitting: Gastroenterology

## 2023-02-05 ENCOUNTER — Other Ambulatory Visit: Payer: Self-pay

## 2023-02-05 MED ORDER — METOPROLOL SUCCINATE ER 50 MG PO TB24: ORAL_TABLET | ORAL | 1 refills | Status: DC

## 2023-02-23 ENCOUNTER — Ambulatory Visit (AMBULATORY_SURGERY_CENTER): Payer: Self-pay

## 2023-02-23 VITALS — Ht 70.0 in | Wt 190.0 lb

## 2023-02-23 DIAGNOSIS — Z1211 Encounter for screening for malignant neoplasm of colon: Secondary | ICD-10-CM

## 2023-02-23 MED ORDER — NA SULFATE-K SULFATE-MG SULF 17.5-3.13-1.6 GM/177ML PO SOLN
1.0000 | Freq: Once | ORAL | 0 refills | Status: AC
Start: 2023-02-23 — End: 2023-02-23

## 2023-02-23 NOTE — Progress Notes (Signed)

## 2023-03-11 ENCOUNTER — Encounter: Payer: Self-pay | Admitting: Cardiovascular Disease

## 2023-03-11 NOTE — Progress Notes (Unsigned)
Andrew Harrell Date of Birth  February 15, 1961 [facility] [address]     [address] [place], [place]  [address] [phone]  Fax  [phone]  Problem List 1. Dyslipidemia.    Andrew Harrell is a relatively healthy 62 year old gentleman with a history of dyslipidemia. He has a strong family history of coronary disease. He's felt very well. He's not had any episodes of chest pain or shortness of breath.  He's exercising on a regular basis and is able to do all of his normal activities without any limitations.  October 28, 2012:  Andrew Harrell has done well.  No CP or dyspnea.     November 17, 2013: Andrew Harrell is doing well.   Not exercising as much - still walks twice a week.    Sept. 6, 2016:  Andrew Harrell is doing well. No CP , Spent the weekend [place] the beach .  BP is normal   Oct. 20, 2017:  Andrew Harrell is doing very well. He denies any chest pain or shortness of breath.  He's been taking one half of his Toprol-XL dose and feels very well.  May 18, 2017: Doing well  BP is well controlled.     Exercises regularly .   Walks 2-3 times a week   May 21, 2018: Andrew Harrell  is seen today for follow-up of his hypertension and hyperlipidemia. Exercises regularly .    [place]  No CP , no dyspnea.   May 28, 2019:  Andrew Harrell is seen today for follow-up of his hypertension and hyperlipidemia. No cp , no dyspnea Trying to stay active.  Sticks to a good diet  Sep 05, 2021: Andrew Harrell is seen today for follow-up of his hypertension and hyperlipidemia.  His significant other , Andrew Harrell is in the hop\spital  with NSTEMI.   Doing better .  No CP,  no dyspnea    Nov. 4, 2024 Andrew Harrell is seen for follow up of his HTN, HLD  Significant  Other Andrew Harrell is now followed in the Advanced [facility] clinic   His father , Andrew Harrell  Is now in hospice .   Has been bed bound since June , 2023  ICD has been turned off       Current Outpatient Medications on File Prior to  Visit  Medication Sig Dispense Refill   aspirin 81 MG tablet Take 81 mg by mouth daily.     ezetimibe (ZETIA) 10 MG tablet Take 1 tablet (10 mg total) by mouth daily. 90 tablet 0   FLUoxetine (PROZAC) 20 MG capsule Take 20 mg by mouth daily.     metoprolol succinate (TOPROL-XL) 50 MG 24 hr tablet TAKE 1 TABLET BY MOUTH EVERY DAY WITH OR IMMEDIATELY FOLLOWING A MEAL. (Patient taking differently: Take 25 mg by mouth daily. TAKE  1/2 TABLET BY MOUTH EVERY DAY WITH OR IMMEDIATELY FOLLOWING A MEAL.) 30 tablet 1   rosuvastatin (CRESTOR) 20 MG tablet Take 20 mg by mouth daily.     No current facility-administered medications on file prior to visit.    Allergies  Allergen Reactions   Prednisone     Causes a Rapid Heart Rate    Past Medical History:  Diagnosis Date   Anxiety    Dyslipidemia    with a low HDL   Palpitations     Past Surgical History:  Procedure Laterality Date   COLONOSCOPY  2013   HERNIA REPAIR  05/08/1964   US ECHOCARDIOGRAPHY  05/21/2006   Est EF 55-60%    Social History   Tobacco Use  Smoking Status Never  Smokeless Tobacco Never    Social History   Substance and Sexual Activity  Alcohol Use No    Family History  Problem Relation Age of Onset   Coronary artery disease Father    Heart disease Father    Heart failure Father    Hyperlipidemia Father    Colon cancer Neg Hx    Stomach cancer Neg Hx    Rectal cancer Neg Hx    Esophageal cancer Neg Hx     Reviw of Systems:  Reviewed in the HPI.  All other systems are negative.   Physical Exam: Blood pressure 118/76, pulse (!) 48, height 5\' 10"  (1.778 m), weight 190 lb (86.2 kg), SpO2 99%.       GEN:  Well nourished, well developed in no acute distress HEENT: Normal NECK: No JVD; No carotid bruits LYMPHATICS: No lymphadenopathy CARDIAC: RRR , no murmurs, rubs, gallops RESPIRATORY:  Clear to auscultation without rales, wheezing or rhonchi  ABDOMEN: Soft, non-tender,  non-distended MUSCULOSKELETAL:  No edema; No deformity  SKIN: Warm and dry NEUROLOGIC:  Alert and oriented x 3    ECG:    EKG Interpretation Date/Time:  Monday March 12 2023 16:30:59 EST Ventricular Rate:  47 PR Interval:  122 QRS Duration:  92 QT Interval:  484 QTC Calculation: 428 R Axis:   46  Text Interpretation: Sinus bradycardia No previous ECGs available Confirmed by Kristeen Miss ([id number]) on 03/12/2023 4:45:52 PM    Assessment / Plan:   1. Dyslipidemia:   lipids from last year look great .     He had more recent labs with Dr. Eloise Harman.   He will try to upload them to his chart via Mychart     2.  HTN:  BP is well controlled.    No symptoms  Kristeen Miss, MD  03/12/2023 4:55 PM    [facility] [address] [place],  [address] [place], [place]  [address] Pager [phone] Phone: [phone]; Fax: [phone]

## 2023-03-12 ENCOUNTER — Ambulatory Visit: Payer: PRIVATE HEALTH INSURANCE | Attending: Cardiovascular Disease | Admitting: Cardiovascular Disease

## 2023-03-12 ENCOUNTER — Encounter: Payer: Self-pay | Admitting: Cardiovascular Disease

## 2023-03-12 VITALS — BP 118/76 | HR 48 | Ht 70.0 in | Wt 190.0 lb

## 2023-03-12 DIAGNOSIS — I10 Essential (primary) hypertension: Secondary | ICD-10-CM

## 2023-03-12 DIAGNOSIS — E785 Hyperlipidemia, unspecified: Secondary | ICD-10-CM

## 2023-03-12 NOTE — Patient Instructions (Signed)
Follow-Up: At [facility], you and your health needs are our priority.  As part of our continuing mission to provide you with exceptional heart care, we have created designated Provider Care Teams.  These Care Teams include your primary Cardiologist (physician) and Advanced Practice Providers (APPs -  Physician Assistants and Nurse Practitioners) who all work together to provide you with the care you need, when you need it.  Your next appointment:   1 year(s)  Provider:   Thurmon Fair, MD

## 2023-03-16 ENCOUNTER — Encounter: Payer: Self-pay | Admitting: Gastroenterology

## 2023-03-16 ENCOUNTER — Ambulatory Visit (AMBULATORY_SURGERY_CENTER): Payer: PRIVATE HEALTH INSURANCE | Admitting: Gastroenterology

## 2023-03-16 VITALS — BP 96/52 | HR 64 | Temp 97.8°F | Resp 12 | Ht 70.0 in | Wt 190.0 lb

## 2023-03-16 DIAGNOSIS — D128 Benign neoplasm of rectum: Secondary | ICD-10-CM

## 2023-03-16 DIAGNOSIS — Z1211 Encounter for screening for malignant neoplasm of colon: Secondary | ICD-10-CM

## 2023-03-16 DIAGNOSIS — D120 Benign neoplasm of cecum: Secondary | ICD-10-CM

## 2023-03-16 MED ORDER — SODIUM CHLORIDE 0.9 % IV SOLN: 500.0000 mL | Freq: Once | INTRAVENOUS | Status: DC

## 2023-03-16 NOTE — Op Note (Signed)
[facility] Patient Name: Andrew Harrell Procedure Date: 03/16/2023 9:07 AM MRN: [id number] Endoscopist: Lorin Picket E. Tomasa Rand , MD, [id number] Age: 62 Referring MD:  Date of Birth: 1960/05/25 Gender: Male Account #: [id number] Procedure:                Colonoscopy Indications:              Screening for colorectal malignant neoplasm (last                            colonoscopy was more than 10 years ago) Medicines:                Monitored Anesthesia Care Procedure:                Pre-Anesthesia Assessment:                           - Prior to the procedure, a History and Physical                            was performed, and patient medications and                            allergies were reviewed. The patient's tolerance of                            previous anesthesia was also reviewed. The risks                            and benefits of the procedure and the sedation                            options and risks were discussed with the patient.                            All questions were answered, and informed consent                            was obtained. Prior Anticoagulants: The patient has                            taken no anticoagulant or antiplatelet agents. ASA                            Grade Assessment: II - A patient with mild systemic                            disease. After reviewing the risks and benefits,                            the patient was deemed in satisfactory condition to                            undergo the procedure.  After obtaining informed consent, the colonoscope                            was passed under direct vision. Throughout the                            procedure, the patient's blood pressure, pulse, and                            oxygen saturations were monitored continuously. The                            Olympus Scope SN: [id number] was introduced through                            the anus and  advanced to the the cecum, identified                            by appendiceal orifice and ileocecal valve. The                            colonoscopy was performed without difficulty. The                            patient tolerated the procedure well. The quality                            of the bowel preparation was adequate. The                            ileocecal valve, appendiceal orifice, and rectum                            were photographed. The bowel preparation used was                            SUPREP via split dose instruction. Scope In: 9:14:50 AM Scope Out: 9:35:10 AM Scope Withdrawal Time: 0 hours 16 minutes 50 seconds  Total Procedure Duration: 0 hours 20 minutes 20 seconds  Findings:                 The perianal and digital rectal examinations were                            normal. Pertinent negatives include normal                            sphincter tone and no palpable rectal lesions.                           A 4 mm polyp was found in the cecum. The polyp was                            sessile. The polyp was removed  with a cold snare.                            Resection and retrieval were complete. Estimated                            blood loss was minimal.                           A 7 mm polyp was found in the rectum. The polyp was                            sessile. The polyp was removed with a cold snare.                            Resection and retrieval were complete. Estimated                            blood loss was minimal.                           Many large-mouthed and small-mouthed diverticula                            were found in the sigmoid colon, descending colon                            and transverse colon. There was no evidence of                            diverticular bleeding.                           The exam was otherwise normal throughout the                            examined colon.                           Non-bleeding  internal hemorrhoids were found during                            retroflexion. The hemorrhoids were Grade I                            (internal hemorrhoids that do not prolapse).                           No additional abnormalities were found on                            retroflexion. Complications:            No immediate complications. Estimated Blood Loss:     Estimated blood loss was minimal. Impression:               -  One 4 mm polyp in the cecum, removed with a cold                            snare. Resected and retrieved.                           - One 7 mm polyp in the rectum, removed with a cold                            snare. Resected and retrieved.                           - Moderate diverticulosis in the sigmoid colon, in                            the descending colon and in the transverse colon.                            There was no evidence of diverticular bleeding.                           - Non-bleeding internal hemorrhoids. Recommendation:           - Patient has a contact number available for                            emergencies. The signs and symptoms of potential                            delayed complications were discussed with the                            patient. Return to normal activities tomorrow.                            Written discharge instructions were provided to the                            patient.                           - Resume previous diet.                           - Continue present medications.                           - Await pathology results.                           - Repeat colonoscopy (date not yet determined) for                            surveillance based on pathology results.                           -  Recommend high fiber diet/daily fiber supplement                            to reduce risk of diverticular complications. Danton Palmateer E. Tomasa Rand, MD 03/16/2023 9:39:11 AM This report has been signed  electronically.

## 2023-03-16 NOTE — Progress Notes (Signed)
Report to PACU, RN, vss, BBS= Clear.  

## 2023-03-16 NOTE — Patient Instructions (Addendum)
Resume previous diet.                           - Continue present medications.                           - Await pathology results.                           - Repeat colonoscopy (date not yet determined) for                            surveillance based on pathology results.                           - Recommend high fiber diet/daily fiber supplement                            to reduce risk of diverticular complications.  Handout on polyps and diverticulosis given.      YOU HAD AN ENDOSCOPIC PROCEDURE TODAY AT THE [facility]:   Refer to the procedure report that was given to you for any specific questions about what was found during the examination.  If the procedure report does not answer your questions, please call your gastroenterologist to clarify.  If you requested that your care partner not be given the details of your procedure findings, then the procedure report has been included in a sealed envelope for you to review at your convenience later.  YOU SHOULD EXPECT: Some feelings of bloating in the abdomen. Passage of more gas than usual.  Walking can help get rid of the air that was put into your GI tract during the procedure and reduce the bloating. If you had a lower endoscopy (such as a colonoscopy or flexible sigmoidoscopy) you may notice spotting of blood in your stool or on the toilet paper. If you underwent a bowel prep for your procedure, you may not have a normal bowel movement for a few days.  Please Note:  You might notice some irritation and congestion in your nose or some drainage.  This is from the oxygen used during your procedure.  There is no need for concern and it should clear up in a day or so.  SYMPTOMS TO REPORT IMMEDIATELY:  Following lower endoscopy (colonoscopy or flexible sigmoidoscopy):  Excessive amounts of blood in the stool  Significant tenderness or worsening of abdominal pains  Swelling of the abdomen that is new, acute  Fever of 100F  or higher   For urgent or emergent issues, a gastroenterologist can be reached at any hour by calling [phone]. Do not use MyChart messaging for urgent concerns.    DIET:  We do recommend a small meal at first, but then you may proceed to your regular diet.  Drink plenty of fluids but you should avoid alcoholic beverages for 24 hours.  ACTIVITY:  You should plan to take it easy for the rest of today and you should NOT DRIVE or use heavy machinery until tomorrow (because of the sedation medicines used during the test).    FOLLOW UP: Our staff will call the number listed on your records the next business day following your procedure.  We will call around 7:15- 8:00 am to check  on you and address any questions or concerns that you may have regarding the information given to you following your procedure. If we do not reach you, we will leave a message.     If any biopsies were taken you will be contacted by phone or by letter within the next 1-3 weeks.  Please call us at [phone] if you have not heard about the biopsies in 3 weeks.    SIGNATURES/CONFIDENTIALITY: You and/or your care partner have signed paperwork which will be entered into your electronic medical record.  These signatures attest to the fact that that the information above on your After Visit Summary has been reviewed and is understood.  Full responsibility of the confidentiality of this discharge information lies with you and/or your care-partner.

## 2023-03-16 NOTE — Progress Notes (Signed)
[facility] History and Physical   Primary Care Physician:  Garlan Fillers, MD   Reason for Procedure:   Colon cancer screening  Plan:    Screening colonoscopy     HPI: Andrew Harrell is a 62 y.o. male undergoing average risk screening colonoscopy.  He has no family history of colon cancer and no chronic GI symptoms.  He had a normal colonoscopy in 2013.   Past Medical History:  Diagnosis Date   Anxiety    Dyslipidemia    with a low HDL   Palpitations     Past Surgical History:  Procedure Laterality Date   COLONOSCOPY  2013   HERNIA REPAIR  05/08/1964   US ECHOCARDIOGRAPHY  05/21/2006   Est EF 55-60%    Prior to Admission medications   Medication Sig Start Date End Date Taking? Authorizing Provider  aspirin 81 MG tablet Take 81 mg by mouth daily.   Yes [provider]  ezetimibe (ZETIA) 10 MG tablet Take 1 tablet (10 mg total) by mouth daily. 12/26/22  Yes Nahser, Deloris Ping, MD  FLUoxetine (PROZAC) 20 MG capsule Take 20 mg by mouth daily. 06/23/21  Yes [provider]  metoprolol succinate (TOPROL-XL) 50 MG 24 hr tablet TAKE 1 TABLET BY MOUTH EVERY DAY WITH OR IMMEDIATELY FOLLOWING A MEAL. Patient taking differently: Take 25 mg by mouth daily. TAKE  1/2 TABLET BY MOUTH EVERY DAY WITH OR IMMEDIATELY FOLLOWING A MEAL. 02/05/23  Yes Nahser, Deloris Ping, MD  rosuvastatin (CRESTOR) 20 MG tablet Take 20 mg by mouth daily. 01/30/23  Yes [provider]    Current Outpatient Medications  Medication Sig Dispense Refill   aspirin 81 MG tablet Take 81 mg by mouth daily.     ezetimibe (ZETIA) 10 MG tablet Take 1 tablet (10 mg total) by mouth daily. 90 tablet 0   FLUoxetine (PROZAC) 20 MG capsule Take 20 mg by mouth daily.     metoprolol succinate (TOPROL-XL) 50 MG 24 hr tablet TAKE 1 TABLET BY MOUTH EVERY DAY WITH OR IMMEDIATELY FOLLOWING A MEAL. (Patient taking differently: Take 25 mg by mouth daily. TAKE  1/2 TABLET BY MOUTH EVERY DAY WITH OR  IMMEDIATELY FOLLOWING A MEAL.) 30 tablet 1   rosuvastatin (CRESTOR) 20 MG tablet Take 20 mg by mouth daily.     Current Facility-Administered Medications  Medication Dose Route Frequency Provider Last Rate Last Admin   0.9 %  sodium chloride infusion  500 mL Intravenous Once Jenel Lucks, MD        Allergies as of 03/16/2023 - Review Complete 03/16/2023  Allergen Reaction Noted   Prednisone  12/27/2010    Family History  Problem Relation Age of Onset   Coronary artery disease Father    Heart disease Father    Heart failure Father    Hyperlipidemia Father    Colon cancer Neg Hx    Stomach cancer Neg Hx    Rectal cancer Neg Hx    Esophageal cancer Neg Hx     Social History   Socioeconomic History   Marital status: Single    Spouse name: Not on file   Number of children: Not on file   Years of education: Not on file   Highest education level: Not on file  Occupational History   Not on file  Tobacco Use   Smoking status: Never   Smokeless tobacco: Never  Vaping Use   Vaping status: Never Used  Substance and Sexual Activity  Alcohol use: No   Drug use: No   Sexual activity: Not on file  Other Topics Concern   Not on file  Social History Narrative   Not on file   Social Determinants of Health   Financial Resource Strain: Not on file  Food Insecurity: Not on file  Transportation Needs: Not on file  Physical Activity: Not on file  Stress: Not on file  Social Connections: Not on file  Intimate Partner Violence: Not on file    Review of Systems:  All other review of systems negative except as mentioned in the HPI.  Physical Exam: Vital signs BP 122/66 (BP Location: Right Arm, Patient Position: Sitting, Cuff Size: Normal)   Pulse 71   Temp 97.8 F (36.6 C) (Temporal)   Ht 5\' 10"  (1.778 m)   Wt 190 lb (86.2 kg)   SpO2 97%   BMI 27.26 kg/m   General:   Alert,  Well-developed, well-nourished, pleasant and cooperative in NAD Airway:  Mallampati  2 Lungs:  Clear throughout to auscultation.   Heart:  Regular rate and rhythm; no murmurs, clicks, rubs,  or gallops. Abdomen:  Soft, nontender and nondistended. Normal bowel sounds.   Neuro/Psych:  Normal mood and affect. A and O x 3   Andrew Harrell E. Tomasa Rand, MD [facility] : [facility]

## 2023-03-16 NOTE — Progress Notes (Signed)
Vitals-Autumn  Pt's states no medical or surgical changes since previsit or office visit.

## 2023-03-16 NOTE — Progress Notes (Signed)
Called to room to assist during endoscopic procedure.  Patient ID and intended procedure confirmed with present staff. Received instructions for my participation in the procedure from the performing physician.  

## 2023-03-19 ENCOUNTER — Telehealth: Payer: Self-pay

## 2023-03-19 NOTE — Telephone Encounter (Signed)
  Follow up Call-     03/16/2023    8:28 AM 03/16/2023    8:23 AM  Call back number  Post procedure Call Back phone  # [phone]   Permission to leave phone message  Yes     Patient questions:  Do you have a fever, pain , or abdominal swelling? No. Pain Score  0 *  Have you tolerated food without any problems? Yes.    Have you been able to return to your normal activities? Yes.    Do you have any questions about your discharge instructions: Diet   No. Medications  No. Follow up visit  No.  Do you have questions or concerns about your Care? No.  Actions: * If pain score is 4 or above: No action needed, pain <4.

## 2023-03-21 LAB — SURGICAL PATHOLOGY

## 2023-03-21 NOTE — Progress Notes (Signed)
Andrew Harrell, The two polyps which I removed during your recent procedure were proven to be completely benign but are considered "pre-cancerous" polyps that MAY have grown into cancer if they had not been removed.  Studies shows that at least 20% of women over age 62 and 30% of men over age 36 have pre-cancerous polyps.  Based on current nationally recognized surveillance guidelines, I recommend that you have a repeat colonoscopy in 7 years.   If you develop any new rectal bleeding, abdominal pain or significant bowel habit changes, please contact me before then.

## 2023-04-02 ENCOUNTER — Other Ambulatory Visit: Payer: Self-pay | Admitting: Cardiovascular Disease

## 2023-06-14 ENCOUNTER — Other Ambulatory Visit: Payer: Self-pay | Admitting: Cardiovascular Disease

## 2023-07-12 IMAGING — CT CT CARDIAC CORONARY ARTERY CALCIUM SCORE
3 series · 13 of 20 positions shown, 15 images · non-contrast
Comparison: None.

Addendum:
CLINICAL DATA: Cardiovascular Disease Risk stratification

EXAM:
Coronary Calcium Score
TECHNIQUE: A gated, non-contrast computed tomography scan of the heart was
performed using 3mm slice thickness. Axial images were analyzed on a
dedicated workstation. Calcium scoring of the coronary arteries was
performed using the Agatston method.

[Series 2: cascseq 2.0 sa36 70% (id) · axial · 0.39mm/px · z∈[-272,-212]mm · 3 of 76 slices shown]
[im 16/76  vessel]
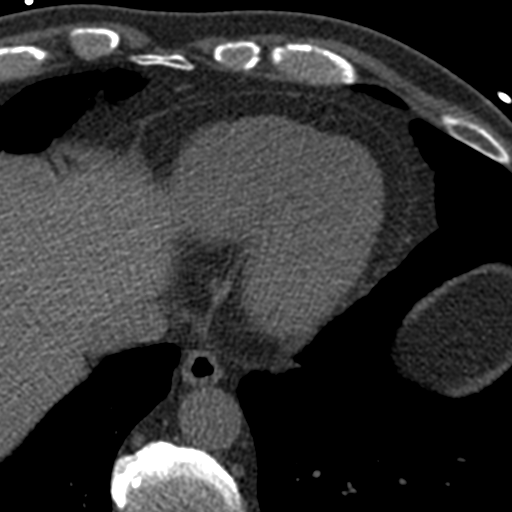
[im 31/76  vessel]
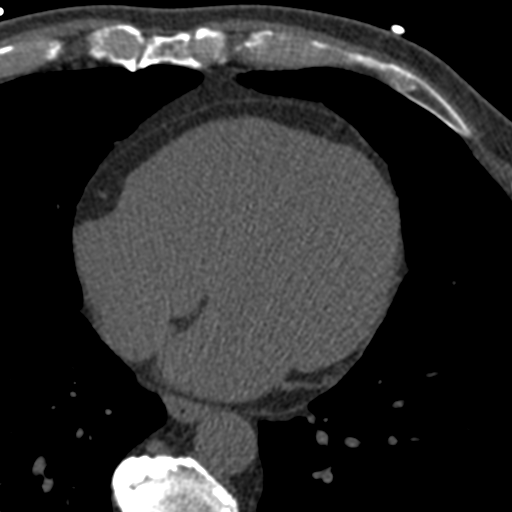
[im 46/76  vessel]
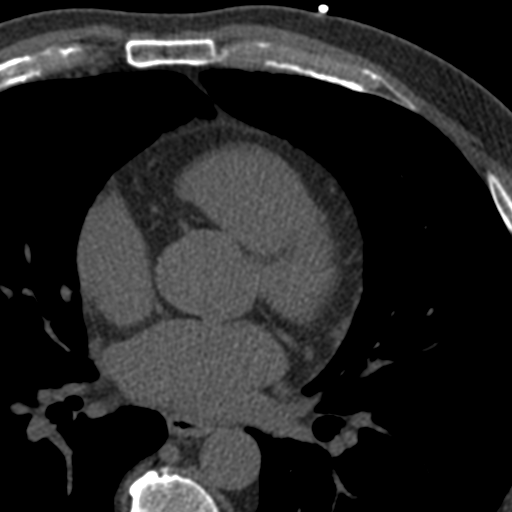

[Series 3: cascseq 2.0 bf37 st · axial · 0.75mm/px · z∈[-278,-178]mm · 5 of 76 slices shown, 7 images]
[im 13/76  vessel]
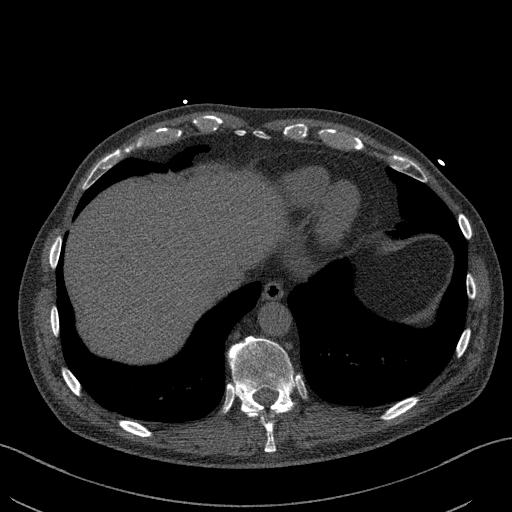
[im 13/76  lung]
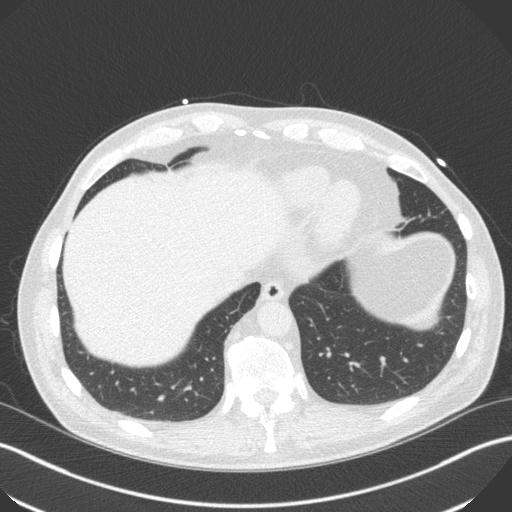
[im 26/76  vessel]
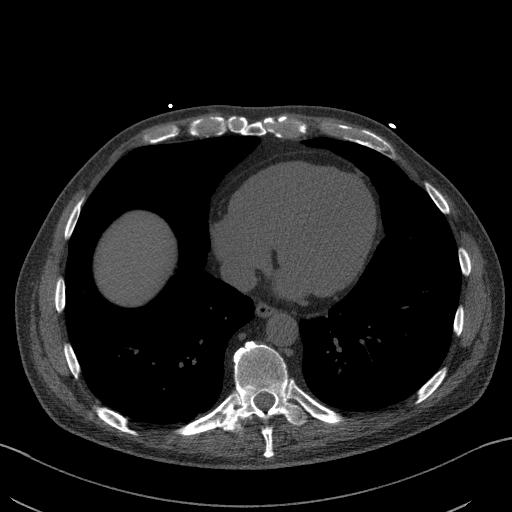
[im 38/76  vessel]
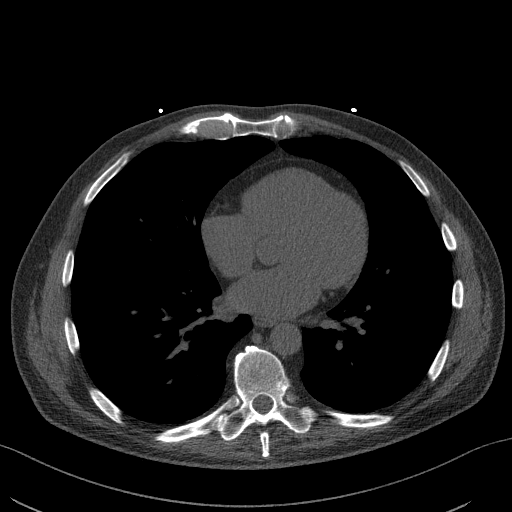
[im 51/76  vessel]
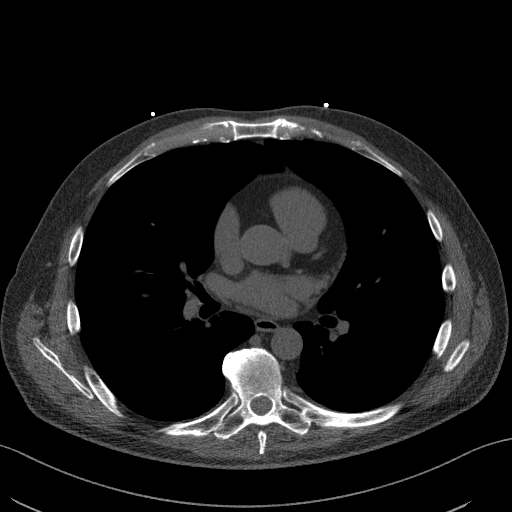
[im 63/76  vessel]
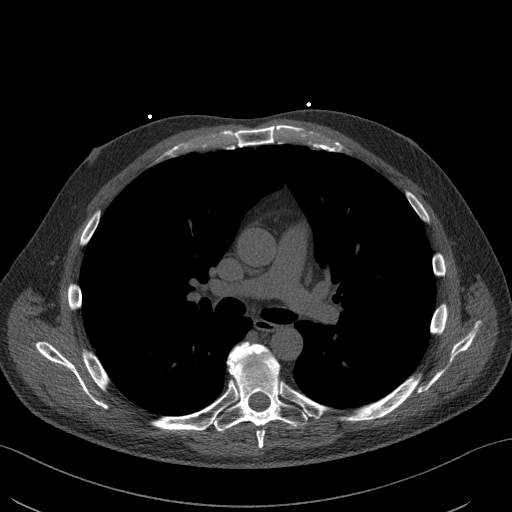
[im 63/76  lung]
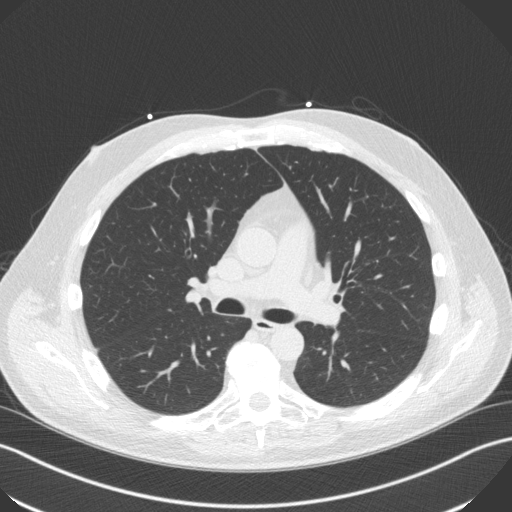

[Series 4: cascseq 2.0 br59 lung · axial · 0.75mm/px · z∈[-278,-178]mm · 5 of 76 slices shown]
[im 13/76  lung]
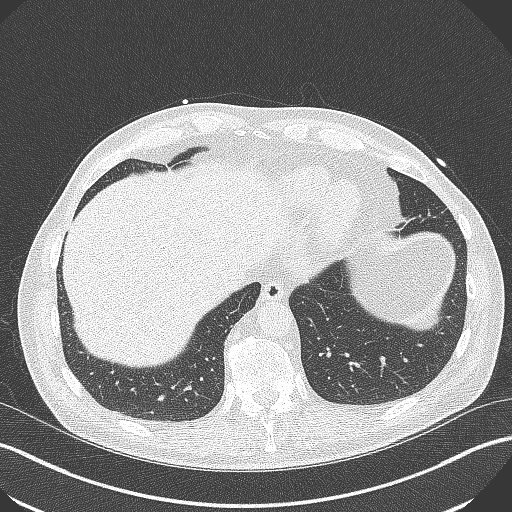
[im 26/76  lung]
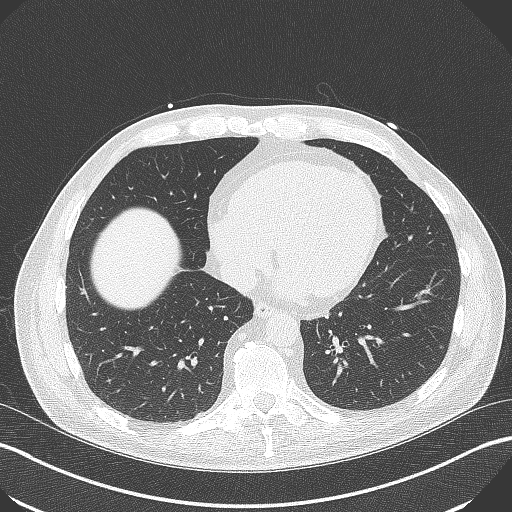
[im 38/76  lung]
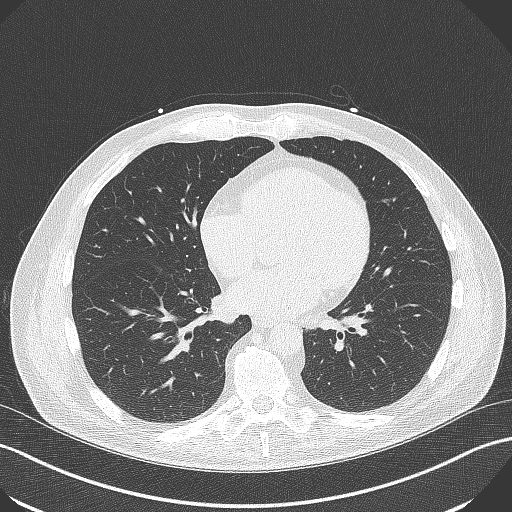
[im 51/76  lung]
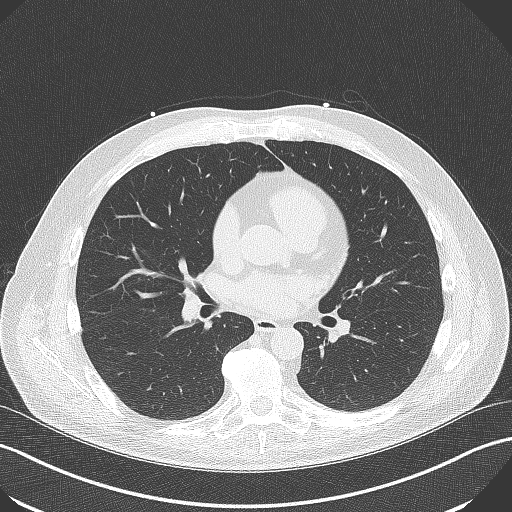
[im 63/76  lung]
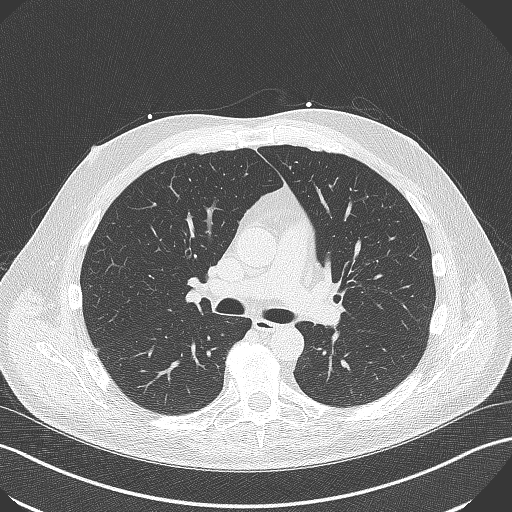

[13 of 20 positions shown; findings below may reference images not displayed]

FINDINGS: Coronary arteries: Normal origins.

Coronary Calcium Score:

Left main: 0

Left anterior descending artery:

Left circumflex artery: 0

Right coronary artery: 0

Total:

Percentile: 71st

Pericardium: Normal.

Aorta: Normal caliber.

Non-cardiac: See separate report from [REDACTED].
IMPRESSION: Coronary calcium score of 29.5. This was 71st percentile for age-,
race-, and sex-matched controls.



If CAC=0, it is reasonable to withhold statin therapy and reassess
in 5 to 10 years, as long as higher risk conditions are absent
(diabetes mellitus, family history of premature CHD in first degree
relatives (males <55 years; females <65 years), cigarette smoking,
or LDL >=190 mg/dL).

If CAC is 1 to 99, it is reasonable to initiate statin therapy for
patients >=55 years of age.

If CAC is >=100 or >=75th percentile, it is reasonable to initiate
statin therapy at any age.

Cardiology referral should be considered for patients with CAC
scores >=400 or >=75th percentile.

*1379 AHA/ACC/AACVPR/AAPA/ABC/MAE/BU YOUSIF/ERXLEBEN/Moretti/LEV/KARTTUNEN/CHACHARO
Guideline on the Management of Blood Cholesterol: A Report of the
American College of Cardiology/American Heart Association Task Force
on Clinical Practice Guidelines. J Am Coll Cardiol.
7220;73(24):4562-4140.

EXAM:
OVER-READ INTERPRETATION  CT CHEST

The following report is an over-read performed by radiologist Dr.
does not include interpretation of cardiac or coronary anatomy or
pathology. The coronary calcium score interpretation by the
cardiologist is attached.
FINDINGS: Vascular: Coronary artery calcifications. No significant
extracardiac vascular findings.

Mediastinum/Nodes: No lymphadenopathy.

Lungs/Pleura: There is a 3 mm pulmonary nodule in the left lower
lobe (series 4, image 53). There is a 5 mm pulmonary nodule in the
right lower lobe (series 4, image 43). There is an intrapulmonary
lymph node along the right oblique fissure. There are additional few
subpleural 2-3 mm nodules in the lower lobes.

Upper Abdomen: No acute abnormality.

Musculoskeletal: No acute osseous abnormality. No suspicious osseous
lesion. Degenerative changes of the spine.
IMPRESSION: Multiple bilateral pulmonary nodules, largest measuring 5 mm in the
right lower lobe. No follow-up needed if patient is low-risk (and
has no known or suspected primary neoplasm). Non-contrast chest CT
can be considered in 12 months if patient is high-risk. This
recommendation follows the consensus statement: Guidelines for
Management of Incidental Pulmonary Nodules Detected on CT Images:

*** End of Addendum ***
FINDINGS: Coronary arteries: Normal origins.

Coronary Calcium Score:

Left main: 0

Left anterior descending artery:

Left circumflex artery: 0

Right coronary artery: 0

Total:

Percentile: 71st

Pericardium: Normal.

Aorta: Normal caliber.

Non-cardiac: See separate report from [REDACTED].
IMPRESSION: Coronary calcium score of 29.5. This was 71st percentile for age-,
race-, and sex-matched controls.



If CAC=0, it is reasonable to withhold statin therapy and reassess
in 5 to 10 years, as long as higher risk conditions are absent
(diabetes mellitus, family history of premature CHD in first degree
relatives (males <55 years; females <65 years), cigarette smoking,
or LDL >=190 mg/dL).

If CAC is 1 to 99, it is reasonable to initiate statin therapy for
patients >=55 years of age.

If CAC is >=100 or >=75th percentile, it is reasonable to initiate
statin therapy at any age.

Cardiology referral should be considered for patients with CAC
scores >=400 or >=75th percentile.

*1379 AHA/ACC/AACVPR/AAPA/ABC/MAE/BU YOUSIF/ERXLEBEN/Moretti/LEV/KARTTUNEN/CHACHARO
Guideline on the Management of Blood Cholesterol: A Report of the
American College of Cardiology/American Heart Association Task Force
on Clinical Practice Guidelines. J Am Coll Cardiol.
7220;73(24):4562-4140.

## 2024-04-22 ENCOUNTER — Other Ambulatory Visit: Payer: Self-pay | Admitting: Physician Assistant

## 2024-04-23 MED ORDER — EZETIMIBE 10 MG PO TABS: 10.0000 mg | ORAL_TABLET | Freq: Every day | ORAL | 0 refills | Status: DC

## 2024-05-26 ENCOUNTER — Telehealth: Payer: Self-pay

## 2024-05-30 MED ORDER — EZETIMIBE 10 MG PO TABS: 10.0000 mg | ORAL_TABLET | Freq: Every day | ORAL | 4 refills | Status: AC

## 2024-05-30 NOTE — Telephone Encounter (Signed)
" °*  STAT* If patient is at the pharmacy, call can be transferred to refill team.   1. Which medications need to be refilled? (please list name of each medication and dose if known)   ezetimibe  (ZETIA ) 10 MG tablet   2. Would you like to learn more about the convenience, safety, & potential cost savings by using the [facility]?   3. Are you open to using the [facility] (Type [facility]. ).  4. Which pharmacy/location (including street and city if local pharmacy) is medication to be sent to?  [facility] [address] - [place], [place] - [address]   5. Do they need a 30 day or 90 day supply?   30 day  Patient stated he is completely out of this medication.  Patient has appointment scheduled with Dr. Francyne on 5/18 "

## 2024-09-22 ENCOUNTER — Ambulatory Visit: Payer: Self-pay | Admitting: Cardiovascular Disease
